# Patient Record
Sex: Male | Born: 1975
Health system: Southern US, Community
[De-identification: ages and names within clinical notes are randomized; demographics above are authoritative.]

## PROBLEM LIST (undated history)

## (undated) DIAGNOSIS — T7840XA Allergy, unspecified, initial encounter: Secondary | ICD-10-CM

## (undated) HISTORY — DX: Allergy, unspecified, initial encounter: T78.40XA

---

## 2003-07-05 ENCOUNTER — Ambulatory Visit (HOSPITAL_COMMUNITY): Admission: RE | Admit: 2003-07-05 | Discharge: 2003-07-05 | Payer: Self-pay | Admitting: Orthopedic Surgery

## 2015-12-28 ENCOUNTER — Ambulatory Visit (INDEPENDENT_AMBULATORY_CARE_PROVIDER_SITE_OTHER): Payer: Self-pay | Admitting: Family Medicine

## 2015-12-28 VITALS — BP 108/72 | HR 54 | Temp 97.6°F | Resp 16 | Ht 68.75 in | Wt 192.6 lb

## 2015-12-28 DIAGNOSIS — Z113 Encounter for screening for infections with a predominantly sexual mode of transmission: Secondary | ICD-10-CM

## 2015-12-28 DIAGNOSIS — R300 Dysuria: Secondary | ICD-10-CM

## 2015-12-28 DIAGNOSIS — R001 Bradycardia, unspecified: Secondary | ICD-10-CM

## 2015-12-28 DIAGNOSIS — Z23 Encounter for immunization: Secondary | ICD-10-CM

## 2015-12-28 LAB — POCT URINALYSIS DIP (MANUAL ENTRY)
BILIRUBIN UA: NEGATIVE
BILIRUBIN UA: NEGATIVE
Glucose, UA: NEGATIVE
Leukocytes, UA: NEGATIVE
Nitrite, UA: NEGATIVE
PH UA: 5.5
PROTEIN UA: NEGATIVE
RBC UA: NEGATIVE
SPEC GRAV UA: 1.01
Urobilinogen, UA: 0.2

## 2015-12-28 LAB — POC MICROSCOPIC URINALYSIS (UMFC): MUCUS RE: ABSENT

## 2015-12-28 MED ORDER — CEFTRIAXONE SODIUM 250 MG IJ SOLR
250.0000 mg | Freq: Once | INTRAMUSCULAR | Status: AC
  Administered 2015-12-28: 250 mg via INTRAMUSCULAR

## 2015-12-28 NOTE — Progress Notes (Signed)
Chief Complaint  Patient presents with  . burning in penis    x 1 wk    HPI  Pt here for burning in pain that is daily for the past week.  Noticed that it started after intercourse. Does not use condoms. Has intercourse with females. Pain is 3/10 Pain for 8 days Not sure if it is from the urine or his penis. The burning is there all day Aggravated by Anything spicy Urination does not make it worse Intercourse does not make it worse 5 years ago he had gonorrhea and was treated with injection  He reports that his urine is clear when he drinks water Denies seeing blood in the urine No discharge from the penis  Bradycardia Reports that he exercises and weight lifts Does not have a history of dizziness or palpitations with weight lifting and exercise Drinks plenty of water No family history of heart disease  Desires Flu Vaccination He desires a flu vaccination today Denies history of egg allergy or allergy to flu vaccination No fevers or chills today  No past medical history on file.  No current outpatient prescriptions on file.   Current Facility-Administered Medications  Medication Dose Route Frequency Provider Last Rate Last Dose  . cefTRIAXone (ROCEPHIN) injection 250 mg  250 mg Intramuscular Once Doristine BosworthZoe A Stallings, MD        No Known Allergies   No past surgical history on file.  Social History   Social History  . Marital status: Married    Spouse name: N/A  . Number of children: N/A  . Years of education: N/A   Social History Main Topics  . Smoking status: Never Smoker  . Smokeless tobacco: Never Used  . Alcohol use No  . Drug use: No  . Sexual activity: Not Asked   Other Topics Concern  . None   Social History Narrative  . None    ROS  Objective: Vitals:   12/28/15 1139  BP: 108/72  Pulse: (!) 54  Resp: 16  Temp: 97.6 F (36.4 C)  TempSrc: Oral  SpO2: 99%  Weight: 192 lb 9.6 oz (87.4 kg)  Height: 5' 8.75" (1.746 m)    Physical  Exam  Constitutional: He is oriented to person, place, and time. He appears well-developed and well-nourished.  Athletic build  HENT:  Head: Normocephalic and atraumatic.  Cardiovascular: Regular rhythm and normal heart sounds.   HR 54  Pulmonary/Chest: Effort normal and breath sounds normal. He has no wheezes.  Musculoskeletal:  No flank pain  Neurological: He is alert and oriented to person, place, and time.   UA negative for infection   Assessment and Plan Nathan Parrish was seen today for burning in penis.  Diagnoses and all orders for this visit:  Burning with urination- UA and urine micro negative for infection Will treat empirically for urethritis with Rocephin -     POCT Microscopic Urinalysis ([facility]) -     POCT urinalysis dipstick -     cefTRIAXone (ROCEPHIN) injection 250 mg; Inject 250 mg into the muscle once.  Flu vaccine need -     Flu Vaccine QUAD 36+ mos IM  Screen for STD (sexually transmitted disease)- verbal consent received for HIV -     GC/Chlamydia Probe Amp -     RPR -     HIV antibody  Bradycardia with 51-60 beats per minute- advised to monitor pulse Since he works out this is most likely normal At this time he is asymptomatic and does  not need further evaluation     Zoe A Creta Levin

## 2015-12-28 NOTE — Patient Instructions (Addendum)
   IF you received an x-ray today, you will receive an invoice from [facility]. Please contact [facility] at [phone] with questions or concerns regarding your invoice.   IF you received labwork today, you will receive an invoice from [facility]. Please contact [facility] at [phone] with questions or concerns regarding your invoice.   Our billing staff will not be able to assist you with questions regarding bills from these companies.  You will be contacted with the lab results as soon as they are available. The fastest way to get your results is to activate your My Chart account. Instructions are located on the last page of this paperwork. If you have not heard from us regarding the results in 2 weeks, please contact this office.     Disuria (Dysuria) La disuria es dolor o molestia al orinar. El dolor o la molestia se pueden sentir en el conducto que transporta la orina fuera de la vejiga (uretra) o en el tejido que rodea los genitales. El dolor tambin se puede sentir en la zona de la ingle y en la parte inferior del abdomen y de la espalda. Quizs tenga que orinar con frecuencia o la sensacin repentina de tener que orinar (tenesmo vesical). La disuria puede afectar tanto a hombres como a mujeres, pero es ms comn en las mujeres. La causa puede deberse a muchos problemas diferentes:  Infeccin en las vas urinarias en mujeres.  Infeccin en los riones o la vejiga.  Clculos en los riones o la vejiga.  Ciertas enfermedades de transmisin sexual (ETS), como la clamidia.  Deshidratacin.  Inflamacin de la vagina.  Uso de ciertos medicamentos.  Uso de ciertos jabones o productos perfumados que provocan irritacin. INSTRUCCIONES PARA EL CUIDADO EN EL HOGAR Controle su disuria para ver si hay cambios. Las siguientes indicaciones pueden ayudar a aliviar cualquier molestia que pueda sentir:  Beba suficiente lquido para  mantener la orina clara o de color amarillo plido.  Vace la vejiga con frecuencia. Evite retener la orina durante largos perodos.  Despus de defecar, las mujeres deben limpiarse desde adelante hacia atrs, usando el papel higinico solo una vez.  Vace la vejiga despus de tener relaciones sexuales.  Tome los medicamentos solamente como se lo haya indicado el mdico.  Si le recetaron antibiticos, asegrese de terminarlos, incluso si comienza a sentirse mejor.  Evite la cafena, el t y el alcohol. Estos productos pueden irritar la vejiga y empeorar la disuria. En los hombres, el alcohol puede irritar la prstata.  Concurra a todas las visitas de control como se lo haya indicado el mdico. Esto es importante.  Si le realizaron pruebas para detectar la causa de la disuria, es su responsabilidad retirar los resultados. Consulte en el laboratorio o en el departamento en el que fue realizado el estudio cundo y cmo podr obtener los resultados. Hable con el mdico si tiene alguna pregunta sobre los resultados. SOLICITE ATENCIN MDICA SI:  Siente dolor en la espalda o a los costados del cuerpo.  Tiene fiebre.  Tiene nuseas o vmitos.  Observa sangre en la orina.  Est orinando con ms frecuencia que lo habitual. SOLICITE ATENCIN MDICA DE INMEDIATO SI:  El dolor es intenso y no se alivia con los medicamentos.  No puede retener lquido.  Usted u otra persona advierten algn cambio en su funcin mental.  Tiene una frecuencia cardaca acelerada en reposo.  Tiene temblores o escalofros.  Se siente muy dbil.   Esta informacin no   tiene como fin reemplazar el consejo del mdico. Asegrese de hacerle al mdico cualquier pregunta que tenga.   Document Released: 03/29/2007 Document Revised: 03/30/2014 Elsevier Interactive Patient Education 2016 Elsevier Inc.  

## 2015-12-29 LAB — HIV ANTIBODY (ROUTINE TESTING W REFLEX): HIV: NONREACTIVE

## 2015-12-31 LAB — GC/CHLAMYDIA PROBE AMP
CT PROBE, AMP APTIMA: NOT DETECTED
GC Probe RNA: NOT DETECTED

## 2015-12-31 LAB — RPR

## 2017-01-03 ENCOUNTER — Emergency Department (HOSPITAL_COMMUNITY): Payer: Self-pay

## 2017-01-03 ENCOUNTER — Encounter (HOSPITAL_COMMUNITY): Payer: Self-pay

## 2017-01-03 ENCOUNTER — Emergency Department (HOSPITAL_COMMUNITY)
Admission: EM | Admit: 2017-01-03 | Discharge: 2017-01-03 | Disposition: A | Discharge status: Home / Self Care | Payer: Self-pay | Attending: Emergency Medicine | Admitting: Emergency Medicine

## 2017-01-03 DIAGNOSIS — Y999 Unspecified external cause status: Secondary | ICD-10-CM | POA: Insufficient documentation

## 2017-01-03 DIAGNOSIS — Y93H2 Activity, gardening and landscaping: Secondary | ICD-10-CM | POA: Insufficient documentation

## 2017-01-03 DIAGNOSIS — S060X0A Concussion without loss of consciousness, initial encounter: Secondary | ICD-10-CM | POA: Insufficient documentation

## 2017-01-03 DIAGNOSIS — W11XXXA Fall on and from ladder, initial encounter: Secondary | ICD-10-CM | POA: Insufficient documentation

## 2017-01-03 DIAGNOSIS — S0101XA Laceration without foreign body of scalp, initial encounter: Secondary | ICD-10-CM | POA: Insufficient documentation

## 2017-01-03 DIAGNOSIS — W19XXXA Unspecified fall, initial encounter: Secondary | ICD-10-CM

## 2017-01-03 DIAGNOSIS — S2242XA Multiple fractures of ribs, left side, initial encounter for closed fracture: Secondary | ICD-10-CM | POA: Insufficient documentation

## 2017-01-03 DIAGNOSIS — Y929 Unspecified place or not applicable: Secondary | ICD-10-CM | POA: Insufficient documentation

## 2017-01-03 LAB — CBC
HCT: 40.7 % (ref 39.0–52.0)
Hemoglobin: 14.4 g/dL (ref 13.0–17.0)
MCH: 30.5 pg (ref 26.0–34.0)
MCHC: 35.4 g/dL (ref 30.0–36.0)
MCV: 86.2 fL (ref 78.0–100.0)
Platelets: 184 10*3/uL (ref 150–400)
RBC: 4.72 MIL/uL (ref 4.22–5.81)
RDW: 12.7 % (ref 11.5–15.5)
WBC: 6.5 10*3/uL (ref 4.0–10.5)

## 2017-01-03 LAB — BASIC METABOLIC PANEL
Anion gap: 8 (ref 5–15)
BUN: 18 mg/dL (ref 6–20)
CO2: 25 mmol/L (ref 22–32)
Calcium: 8.8 mg/dL — ABNORMAL LOW (ref 8.9–10.3)
Chloride: 103 mmol/L (ref 101–111)
Creatinine, Ser: 1.2 mg/dL (ref 0.61–1.24)
GFR calc Af Amer: >60 mL/min (ref >60)
GFR calc non Af Amer: >60 mL/min (ref >60)
Glucose, Bld: 108 mg/dL — ABNORMAL HIGH (ref 65–99)
Potassium: 3.6 mmol/L (ref 3.5–5.1)
Sodium: 136 mmol/L (ref 135–145)

## 2017-01-03 MED ORDER — LIDOCAINE-EPINEPHRINE (PF) 2 %-1:200000 IJ SOLN
10.0000 mL | Freq: Once | INTRAMUSCULAR | Status: AC
  Administered 2017-01-03: 10 mL

## 2017-01-03 MED ORDER — FENTANYL CITRATE (PF) 100 MCG/2ML IJ SOLN
50.0000 ug | Freq: Once | INTRAMUSCULAR | Status: AC
  Administered 2017-01-03: 50 ug via INTRAVENOUS

## 2017-01-03 MED ORDER — TETANUS-DIPHTH-ACELL PERTUSSIS 5-2.5-18.5 LF-MCG/0.5 IM SUSP
0.5000 mL | Freq: Once | INTRAMUSCULAR | Status: AC
  Administered 2017-01-03: 0.5 mL via INTRAMUSCULAR

## 2017-01-03 MED ORDER — LIDOCAINE-EPINEPHRINE (PF) 2 %-1:200000 IJ SOLN
INTRAMUSCULAR | Status: AC
  Filled 2017-01-03: qty 20

## 2017-01-03 MED ORDER — OXYCODONE-ACETAMINOPHEN 5-325 MG PO TABS
2.0000 | ORAL_TABLET | Freq: Once | ORAL | Status: AC
  Administered 2017-01-03: 2 via ORAL
  Filled 2017-01-03: qty 2

## 2017-01-03 MED ORDER — TETANUS-DIPHTH-ACELL PERTUSSIS 5-2.5-18.5 LF-MCG/0.5 IM SUSP
INTRAMUSCULAR | Status: AC
  Filled 2017-01-03: qty 0.5

## 2017-01-03 MED ORDER — OXYCODONE-ACETAMINOPHEN 5-325 MG PO TABS: 1.0000 | ORAL_TABLET | Freq: Four times a day (QID) | ORAL | 0 refills | Status: DC | PRN

## 2017-01-03 MED ORDER — FENTANYL CITRATE (PF) 100 MCG/2ML IJ SOLN
50.0000 ug | Freq: Once | INTRAMUSCULAR | Status: AC
  Administered 2017-01-03: 50 ug via INTRAVENOUS
  Filled 2017-01-03: qty 2

## 2017-01-03 MED ORDER — FENTANYL CITRATE (PF) 100 MCG/2ML IJ SOLN
INTRAMUSCULAR | Status: AC
  Filled 2017-01-03: qty 2

## 2017-01-03 NOTE — Discharge Instructions (Signed)
Take Percocet for pain as we discussed.  Follow-up with your primary care physician for re-evaluation in the next 2-3 days.  Have your staples removed in 10 days.  This can be done by your PCP, at an Urgent Care or in the Emergency Department.

## 2017-01-03 NOTE — ED Notes (Signed)
Family at beside. Family given emotional support. 

## 2017-01-03 NOTE — ED Provider Notes (Signed)
[facility]-EMERGENCY DEPT Provider Note   CSN: [id number] Arrival date & time: 01/03/17  9562  History   Chief Complaint Chief Complaint  Patient presents with  . Fall    HPI Nathan Parrish is a 41 y.o. male.  The patient is a healthy 41yo Spanish-speaking male who presents to the ED as a Level 2 trauma after a fall off a 12-foot ladder. The patient was near the top of his ladder using a chainsaw to cut down tree branches when a tree branch swung and knocked the ladder out from underneath him. He fell backwards off of the ladder and landed on his left shoulder on top of the chainsaw handle.  The episode was witnessed by his son, who states the patient did not lose consciousness at any time or vomiting following the fall.  In route to the ED, the patient was hemodynamic stable per EMS. He arrived wearing a cervical collar.  Per report, he was alert and oriented to person only with frequent questioning regarding his whereabouts.  At this time, the patient is complaining of pain to the left, posterior side of his head as well as his left posterior shoulder and mid back.  The patient does not take any medications, including blood thinners.   The history is provided by the patient, a relative and the EMS personnel. The history is limited by a language barrier. Language interpreter used: family members used for translation at the time of initial evaluation.   No past medical history on file.  There are no active problems to display for this patient.  No past surgical history on file.  Home Medications    Prior to Admission medications   Not on File   Family History No family history on file.  Social History Social History  Substance Use Topics  . Smoking status: Not on file  . Smokeless tobacco: Not on file  . Alcohol use Not on file   Allergies   Patient has no allergy information on record.  Review of Systems Review of Systems  Unable to perform ROS: Acuity of condition  Eyes:  Negative for visual disturbance.  Respiratory: Negative for shortness of breath.   Cardiovascular: Negative for chest pain.  Gastrointestinal: Negative for abdominal pain, nausea and vomiting.  Genitourinary: Negative for difficulty urinating.  Musculoskeletal: Positive for arthralgias (left shoulder) and back pain.  Allergic/Immunologic: Negative for immunocompromised state.  Neurological: Positive for headaches.  Hematological: Does not bruise/bleed easily.   Physical Exam Updated Vital Signs BP 120/90 Comment: manual   Pulse 62   Resp 16   SpO2 98%   Physical Exam  Constitutional: He appears well-developed and well-nourished.  HENT:  Head: Normocephalic.  Two lacerations to the left scalp in the occipitoparietal region, ~ 2cm and 1cm; hemostatic  Eyes: Pupils are equal, round, and reactive to light. Conjunctivae and EOM are normal.  Neck: Neck supple. No tracheal deviation present.  c-collar in place; no C-spine TTP  Cardiovascular: Normal rate, regular rhythm, normal heart sounds and intact distal pulses.   No murmur heard. Pulmonary/Chest: Effort normal and breath sounds normal. No respiratory distress.  Abdominal: Soft. He exhibits no mass. There is no tenderness. There is no guarding.  Musculoskeletal: He exhibits no edema.  No pelvic instability on lateral compression; tenderness to palpation over upper thoracic spine; no lumbar spine TTP  Neurological: He is alert.  Alert; oriented to person only; asking repetitive questions; patient following commands appropriately and moving all extremities spontaneously  Skin:  Skin is warm and dry.  Psychiatric: His behavior is normal.  Nursing note and vitals reviewed.  ED Treatments / Results  Labs (all labs ordered are listed, but only abnormal results are displayed) Labs Reviewed - No data to display  EKG  EKG Interpretation None      Radiology No results found.  Procedures .Marland KitchenLaceration Repair Date/Time: 01/03/2017  10:30 PM Performed by: Levester Fresh Authorized by: Pricilla Loveless   Anesthesia (see MAR for exact dosages):    Anesthesia method:  None Laceration details:    Location:  Scalp   Scalp location:  Occipital   Wound length (cm): 2cm and 1cm.   Laceration depth: partial thickness. Repair type:    Repair type:  Simple Pre-procedure details:    Preparation:  Patient was prepped and draped in usual sterile fashion and imaging obtained to evaluate for foreign bodies Exploration:    Wound exploration: entire depth of wound probed and visualized     Contaminated: no   Treatment:    Area cleansed with:  Saline   Amount of cleaning:  Standard   Irrigation solution:  Sterile saline   Irrigation volume:  500cc   Irrigation method:  Pressure wash Skin repair:    Repair method:  Staples   Number of staples:  3 Approximation:    Approximation:  Close   Vermilion border: well-aligned   Post-procedure details:    Dressing:  Antibiotic ointment   Patient tolerance of procedure:  Tolerated well, no immediate complications   (including critical care time)  Medications Ordered in ED Medications - No data to display   Initial Impression / Assessment and Plan / ED Course  I have reviewed the triage vital signs and the nursing notes.  Pertinent labs & imaging results that were available during my care of the patient were reviewed by me and considered in my medical decision making (see chart for details).    Initial differential diagnosis included intracranial bleed, spinal fracture, rib fracture, pneumothorax, scapular fracture, soft tissue injury, and laceration.  Pertinent labs included an unremarkable CBC and BMP.  Imaging studies  Fractures of the left posterior first, second, third.. Intracranial abnormality. No spinal fractures noted.  The patient was given IV narcotics for analgesia and was re-dosed once in the ED.    Upon reassessment, mental status was rapidly improving. He was alert  and oriented to person, place, time, and event.  The patient's scalp lacerations were repaired as detailed above.  I offered to admit the patient for pain management, however he preferred to go home with oral pain medication.  Based on the above findings, I suspect the patient most likely suffered from a concussion, in addition to the injuries noted above.  I have a low suspicion for syncope prior to the patient's fall, given that this was a witnessed episode as detailed above.  I personally ambulated the patient prior to discharge.  He was able to walk without assistance with minimal pain.  I also instructed the patient on the use of an incentive spirometer; 1750cc pulled in the ED.  I discussed the above results with the patient who verbalized understanding.  Return precautions and follow-up plans discussed including has follow-up with his primary care physician and suture removal in 10 days.  The patient was discharged in stable condition.  He was given a prescription for Percocet.  I reviewed the [place] Controlled Substances Reporting System prior to providing a prescription for narcotics with no red flags noted on  my review.  Final Clinical Impressions(s) / ED Diagnoses   Final diagnoses:  Concussion without loss of consciousness, initial encounter  Fall, initial encounter  Laceration of scalp, initial encounter  Closed fracture of multiple ribs of left side, initial encounter   New Prescriptions New Prescriptions   No medications on file     Levester Fresh, MD 01/04/17 0130    Pricilla Loveless, MD 01/06/17 1421

## 2017-01-03 NOTE — ED Notes (Signed)
Pt to CT via stretcher

## 2017-01-03 NOTE — ED Notes (Addendum)
Pt comes via [facility], was cutting down a tree and limb knocked ladder out from under him, fell onto chainsaw, lacerations to back pf head, repeative questions, c/o of L shoulder pain, positive LOC

## 2017-01-03 NOTE — Progress Notes (Signed)
Orthopedic Tech Progress Note Patient Details:  Joanna Hall Mar 19, 1976 [id number] Level 2 trauma ortho visit. Patient ID: Boe Deans, male   DOB: Mar 18, 1976, 41 y.o.   MRN: [id number]   Jennye Moccasin 01/03/2017, 8:39 PM

## 2017-04-27 ENCOUNTER — Ambulatory Visit: Payer: Self-pay | Admitting: Family Medicine

## 2017-06-01 ENCOUNTER — Ambulatory Visit (INDEPENDENT_AMBULATORY_CARE_PROVIDER_SITE_OTHER): Payer: BLUE CROSS/BLUE SHIELD | Admitting: Family Medicine

## 2017-06-01 ENCOUNTER — Encounter: Payer: Self-pay | Admitting: Family Medicine

## 2017-06-01 VITALS — BP 126/80 | HR 50 | Temp 97.8°F | Resp 12 | Ht 68.0 in | Wt 196.4 lb

## 2017-06-01 DIAGNOSIS — J309 Allergic rhinitis, unspecified: Secondary | ICD-10-CM

## 2017-06-01 DIAGNOSIS — R351 Nocturia: Secondary | ICD-10-CM

## 2017-06-01 DIAGNOSIS — Z0000 Encounter for general adult medical examination without abnormal findings: Secondary | ICD-10-CM

## 2017-06-01 DIAGNOSIS — R001 Bradycardia, unspecified: Secondary | ICD-10-CM

## 2017-06-01 DIAGNOSIS — N401 Enlarged prostate with lower urinary tract symptoms: Secondary | ICD-10-CM

## 2017-06-01 MED ORDER — FLUTICASONE PROPIONATE 50 MCG/ACT NA SUSP: 1.0000 | Freq: Two times a day (BID) | NASAL | 6 refills | Status: DC

## 2017-06-01 NOTE — Assessment & Plan Note (Signed)
Before I order further workup, PSA and U/A, I would like to see labs done for life insurance.  He reports having urine test. Recommend avoiding fluid intake within 3-4 hours before bedtime.

## 2017-06-01 NOTE — Progress Notes (Signed)
HPI:   [name] is a 42 y.o. male, who is here today with his wife to establish care.  Former PCP: N/A Last preventive routine visit: Over a year ago  Chronic medical problems: Otherwise healthy, denies history of hypertension, DM, or hyperlipidemia.   Concerns today: He would like to have a CPE.  He is reporting lab work recently done for life insurance about 3 weeks ago.  According to patient, lab work was normal.  He lives with his wife and 5 children. Denies Hx of STD's.  He does exercise regularly but has not done so recently due to recent injury, 3-4 weeks ago. He follows a healthy diet.   Nocturia X 2 for many years. Denies dysuria,increased urinary frequency, gross hematuria,or decreased urine output.  Nasal congestion, rhinorrhea, epiphora, and eye pruritus intermittently for years. He has not used OTC medications. He denies fever, chills, body aches, or sore throat. Nasal congestion worse at night.   On examination bradycardia was noted. He exercises regularly, has not done so for about a month due to injury. In general he denies chest pain, dyspnea, dizziness, or diaphoresis.  States that sometimes he feels fatigue.   Review of Systems  Constitutional: Positive for fatigue. Negative for activity change, appetite change, fever and unexpected weight change.  HENT: Negative for dental problem, nosebleeds, sore throat, trouble swallowing and voice change.   Eyes: Negative for redness and visual disturbance.  Respiratory: Negative for cough, shortness of breath and wheezing.   Cardiovascular: Negative for chest pain, palpitations and leg swelling.  Gastrointestinal: Negative for abdominal pain, blood in stool, nausea and vomiting.       No changes in bowel habits.  Endocrine: Negative for cold intolerance, heat intolerance, polydipsia, polyphagia and polyuria.  Genitourinary: Negative for decreased urine volume, dysuria, genital sores,  hematuria and testicular pain.  Musculoskeletal: Negative for gait problem and myalgias.  Skin: Negative for color change and rash.  Allergic/Immunologic: Positive for environmental allergies.  Neurological: Negative for dizziness, syncope, weakness and headaches.  Hematological: Negative for adenopathy. Does not bruise/bleed easily.  Psychiatric/Behavioral: Negative for confusion and sleep disturbance. The patient is not nervous/anxious.   All other systems reviewed and are negative.   No current outpatient medications on file prior to visit.   No current facility-administered medications on file prior to visit.      Past Medical History:  Diagnosis Date  . Allergy    No Known Allergies  Family History  Problem Relation Age of Onset  . Hypertension Mother   . Hypertension Father   . Asthma Sister   . Asthma Sister   . Asthma Sister     Social History   Socioeconomic History  . Marital status: Married    Spouse name: None  . Number of children: None  . Years of education: None  . Highest education level: None  Social Needs  . Financial resource strain: None  . Food insecurity - worry: None  . Food insecurity - inability: None  . Transportation needs - medical: None  . Transportation needs - non-medical: None  Occupational History  . None  Tobacco Use  . Smoking status: Never Smoker  . Smokeless tobacco: Never Used  Substance and Sexual Activity  . Alcohol use: Yes  . Drug use: No  . Sexual activity: Yes    Partners: Female  Other Topics Concern  . None  Social History Narrative   ** Merged History Encounter **  Vitals:   06/01/17 0812  BP: 126/80  Pulse: (!) 50  Resp: 12  Temp: 97.8 F (36.6 C)  SpO2: 98%    Body mass index is 29.86 kg/m.   Physical Exam  Nursing note and vitals reviewed. Constitutional: He is oriented to person, place, and time. He appears well-developed. No distress.  HENT:  Head: Normocephalic and atraumatic.    Right Ear: Tympanic membrane, external ear and ear canal normal.  Left Ear: Tympanic membrane, external ear and ear canal normal.  Mouth/Throat: Oropharynx is clear and moist and mucous membranes are normal.  Hypertrophic turbinates.   Eyes: Conjunctivae and EOM are normal. Pupils are equal, round, and reactive to light.  Pinguecula nasal angle bilateral.  Neck: Normal range of motion. No tracheal deviation present. No thyromegaly present.  Cardiovascular: Regular rhythm. Bradycardia present.  No murmur heard. Pulses:      Dorsalis pedis pulses are 2+ on the right side, and 2+ on the left side.  Respiratory: Effort normal and breath sounds normal. No respiratory distress.  GI: Soft. He exhibits no mass. There is no tenderness. Hernia confirmed negative in the right inguinal area and confirmed negative in the left inguinal area.  Genitourinary: Penis normal. Prostate is enlarged (Mildly). Prostate is not tender. Right testis shows no mass, no swelling and no tenderness. Left testis shows no mass, no swelling and no tenderness.  Musculoskeletal: He exhibits no edema or tenderness.  No major deformities appreciated and no signs of synovitis.  Lymphadenopathy:    He has no cervical adenopathy.       Right: No inguinal and no supraclavicular adenopathy present.       Left: No inguinal and no supraclavicular adenopathy present.  Neurological: He is alert and oriented to person, place, and time. He has normal strength. No cranial nerve deficit or sensory deficit. Coordination and gait normal.  Reflex Scores:      Bicep reflexes are 2+ on the right side and 2+ on the left side.      Patellar reflexes are 2+ on the right side and 2+ on the left side. Skin: Skin is warm. No erythema.  Psychiatric: His mood appears anxious. Cognition and memory are normal.  Well groomed, good eye contact.    ASSESSMENT AND PLAN:  Mr. Nathan Parrish was seen today for establish care and annual exam.  Orders Placed This  Encounter  Procedures  . Ambulatory referral to Cardiology  . EKG 12-Lead     Routine general medical examination at a health care facility  We discussed the importance of regular physical activity and healthy diet for prevention of chronic illness and/or complications. Preventive guidelines reviewed. Vaccination up to date per pt report. I will hold on lab work until he brings copy of recent labs. Next CPE in a year.   BPH associated with nocturia Before I order further workup, PSA and U/A, I would like to see labs done for life insurance.  He reports having urine test. Recommend avoiding fluid intake within 3-4 hours before bedtime.  Allergic rhinitis Educated about diagnosis. Recommend OTC Zyrtec 10 mg daily, nasal irrigation with saline as needed. Flonase nasal spray 1 spray in each nostril twice daily. Instructed to let me know in about 3 months if he still having symptoms, in which case we might add Singulair 10 mg at night.  Bradycardia, sinus Asymptomatic. EKG today with marked sinus bradycardia (37/min), so cardiology evaluation recommended. He was clearly instructed about warning signs.     Kathie RhodesBetty  G. Martinique, MD  [facility]. [place] office.

## 2017-06-01 NOTE — Assessment & Plan Note (Signed)
Educated about diagnosis. Recommend OTC Zyrtec 10 mg daily, nasal irrigation with saline as needed. Flonase nasal spray 1 spray in each nostril twice daily. Instructed to let me know in about 3 months if he still having symptoms, in which case we might add Singulair 10 mg at night.

## 2017-06-01 NOTE — Patient Instructions (Addendum)
A few things to remember from today's visit:   Routine general medical examination at a health care facility  Allergic rhinitis, unspecified seasonality, unspecified trigger  Bradycardia, sinus - Plan: EKG 12-Lead  Mantenimiento de la salud en los hombres (Health Maintenance, Male) Un estilo de vida saludable y los cuidados preventivos son importantes para la salud y Counsellorel bienestar. Pregntele al mdico cul es el cronograma de exmenes peridicos adecuado para usted. QU DEBO SABER SOBRE EL PESO Y LA DIETA? Consuma una dieta saludable  Coma muchas verduras, frutas, cereales integrales, productos lcteos con bajo contenido de grasa y Associate Professorprotenas magras.  No consuma muchos alimentos de alto contenido de grasas slidas, azcares agregados o sal. Mantenga un peso saludable La actividad fsica habitual puede ayudarlo a Baristaalcanzar o mantener un peso saludable. Deber hacer lo siguiente:  Realizar al menos 150minutos de actividad fsica por semana. El ejercicio debe aumentar la frecuencia cardaca y Development worker, international aidprovocar la transpiracin (ejercicio de Walkertownintensidad moderada).  Hacer ejercicios de entrenamiento de fuerza por lo Rite Aidmenos dos veces por semana. Controlarse los niveles de colesterol y lpidos en la sangre  Hgase anlisis de sangre para controlar los lpidos y el colesterol cada 5aos a partir de los 35aos. Si tiene un riesgo alto de Warehouse managertener cardiopatas coronarias, debe comenzar a Assuranthacerse anlisis de [place] Cooksangre a los [place] Walk20aos. Es posible que Insurance underwriternecesite controlar los niveles de colesterol con mayor frecuencia si: ? Sus niveles de lpidos y colesterol son altos. ? Es mayor de [id number]. ? Tiene un riesgo alto de tener cardiopatas coronarias. QU DEBO SABER SOBRE LAS PRUEBAS DE DETECCIN DEL CNCER? Muchos tipos de cncer se pueden detectar de manera temprana y a menudo prevenirse. Cncer de pulmn  Debe someterse a pruebas de deteccin de cncer de pulmn todos los aos en los siguientes casos: ? Si fuma  actualmente y lo ha hecho durante por lo menos 30aos. ? Si fue fumador que dej el hbito en el trmino de los ltimos 15aos.  Hable con el mdico sobre las opciones en relacin con los estudios de deteccin, cundo debe comenzar a Actuaryhacrselos y con Engineer, structuralqu frecuencia. Cncer colorrectal  Generalmente, las pruebas de deteccin habituales del cncer colorrectal comienzan a los 50aos y deben repetirse cada 5 a 10aos hasta los 75aos. Es posible que tenga que hacerse las pruebas con mayor frecuencia si se detectan formas tempranas de plipos precancerosos o pequeos bultos. Sin embargo, el mdico podr aconsejarle que lo haga antes, si tiene factores de riesgo para el cncer de colon.  El mdico puede recomendarle que use kits de prueba caseros para Recruitment consultanthallar sangre oculta en la materia fecal.  Se puede usar una pequea cmara en el extremo de un tubo para examinar el colon (sigmoidoscopia o colonoscopia). Este estudio PPG Industriesdetecta las formas ms tempranas de Building services engineercncer colorrectal. Cncer de prstata y de testculo  En funcin de la edad y del Sunsetestado de salud general, el mdico puede realizarle determinados estudios de deteccin del cncer de prstata y de testculo.  Hable con el mdico sobre cualquier sntoma o acerca de las inquietudes que tenga sobre el cncer de prstata o de testculo. Cncer de piel  Revise la piel de la cabeza a los pies con regularidad.  Informe al mdico si aparecen nuevos lunares o si nota cambios en los que ya tiene, especialmente en estos casos: ? Si hay un cambio en el tamao, la forma o el color del lunar. ? Si tiene un lunar que es ms grande que el tamao de [place] goma  de lpiz.  Siempre use pantalla solar. Aplquese pantalla solar de [place] y repetida a lo largo del Futures trader.  Use mangas y Automatic Data, un sombrero de ala ancha y gafas para el sol cuando est al Gu[place]upe Dawn, para protegerse. QU DEBO SABER SOBRE LAS CARDIOPATAS CORONARIAS, LA DIABETES Y LA  HIPERTENSIN ARTERIAL?  Si usted tiene entre 18 y 39aos, debe medirse la presin arterial cada 3a 5aos. Si usted tiene 40aos o ms, debe medirse la presin arterial [employer]. Debe medirse la presin arterial dos veces: una vez cuando est en un hospital o una clnica y la otra vez cuando est en otro sitio. Registre el promedio de [employer]. Para controlar su presin arterial cuando no est en un hospital o Paulita Cradle, puede usar lo siguiente: ? Valere Dross automtica para medir la presin arterial en una farmacia. ? Un monitor para medir la presin arterial en el hogar.  Hable con el mdico [employer] ideales de la presin arterial.  Si tiene entre 45 y 79aos, consltele al mdico si debe tomar aspirina para evitar las cardiopatas coronarias.  Hgase anlisis habituales de deteccin de la diabetes; para ello, contrlese la glucemia en ayunas. ? Si su peso es normal y tiene un bajo riesgo de padecer diabetes, realcese este anlisis cada tres aos despus de los 45aos. ? Si tiene sobrepeso y un alto riesgo de padecer diabetes, considere someterse a este anlisis antes o con mayor frecuencia.  Para los hombres que tienen entre 65 y 56aos, y son o han sido fumadores, se recomienda un nico estudio con ecografa para Engineer, manufacturing un aneurisma de aorta abdominal (AAA). QU DEBO SABER SOBRE LA PREVENCIN DE LAS INFECCIONES? HepatitisB Si tiene un riesgo ms alto de Primary school teacher hepatitis B, debe someterse a un examen de deteccin de este virus. Hable con el mdico para determinar si corre riesgo de tener una infeccin por hepatitisB. Hepatitis C Se recomienda un anlisis de [place] para:  Todos los que nacieron entre 1945 y [phone].  Todas las personas que tengan un riesgo de haber contrado hepatitis C. Enfermedades de transmisin sexual (ETS)  Debe realizarse pruebas de Airline pilot de las ETS todos los aos, incluidas la gonorrea y la clamidia, en estos casos: ? Es  sexualmente activo y es menor de [place]. ? Es mayor de 24aos, y Public affairs consultant informa que corre riesgo de tener este tipo de infecciones. ? La actividad sexual ha cambiado desde que le hicieron la ltima prueba de deteccin y tiene un riesgo mayor de Warehouse manager clamidia o Copy. Pregntele al mdico si usted tiene riesgo.  Consulte a su mdico para saber si tiene un alto riesgo de infectarse por el VIH. El mdico puede recomendarle un medicamento de venta con receta para ayudar a evitar la infeccin por el VIH. QU MS PUEDO HACER?  Realcese los estudios de rutina de la salud, dentales y de Wellsite geologist.  Mantngase al da con las vacunas (inmunizaciones).  No consuma ningn producto que contenga tabaco, lo que incluye cigarrillos, tabaco de Theatre manager y Administrator, Civil Service. Si necesita ayuda para dejar de fumar, consulte al mdico.  Limite el consumo de alcohol a no ms de por da. [employer] a 12 onzas de cerveza, 5onzas de vino o 1onzas de bebidas alcohlicas de alta graduacin.  No consuma drogas.  No comparta agujas.  Solicite ayuda a su mdico si necesita apoyo o informacin para abandonar las drogas.  Informe a su mdico si a  menudo se siente deprimido.  Notifique a su mdico si alguna vez ha sido vctima de abuso o si no se siente seguro en su hogar. Esta informacin no tiene Theme park manager el consejo del mdico. Asegrese de hacerle al mdico cualquier pregunta que tenga. Document Released: 09/05/2007 Document Revised: 03/30/2014 Document Reviewed: 12/11/2014 Elsevier Interactive Patient Education  2018 [employer].   Please be sure medication list is accurate. If a new problem present, please set up appointment sooner than planned today.

## 2017-06-01 NOTE — Assessment & Plan Note (Signed)
Asymptomatic. EKG today with marked sinus bradycardia (37/min), so cardiology evaluation recommended. He was clearly instructed about warning signs.

## 2017-06-06 ENCOUNTER — Encounter: Payer: Self-pay | Admitting: Family Medicine

## 2017-07-01 NOTE — Progress Notes (Signed)
ACUTE VISIT   HPI:  Chief Complaint  Patient presents with  . Chest Pain    patient complains of left-sided chest pain x1 month, states he has an appointment with the cardiologist on 5/2    Nathan Parrish is a 42 y.o. male, who is here today with his wife and cousin complaining of 2 weeks of left-sided chest pain. He cannot describe pain, "little pain." Pain is constant, he has not identified exacerbating or alleviating factors, 2-3/10, no radiated, and no associated symptoms. No dyspnea, palpitations, dizziness, diaphoresis, or syncope. No abdominal pain, nausea, or vomiting.  Occasionally "little indigestion", depending of type of food he eats, no heartburn.  Last visit marked bradycardia noted, appointment with cardiologist arranged.  No history of hyperlipidemia, hypertension, diabetes, or tobacco use.  No family history of CAD.  Review of Systems  Constitutional: Negative for activity change, appetite change, fatigue, fever and unexpected weight change.  HENT: Negative for sore throat and trouble swallowing.   Eyes: Negative for redness and visual disturbance.  Respiratory: Negative for cough, shortness of breath and wheezing.   Cardiovascular: Positive for chest pain. Negative for palpitations and leg swelling.  Gastrointestinal: Negative for abdominal pain, nausea and vomiting.  Musculoskeletal: Negative for back pain and gait problem.  Skin: Negative for rash and wound.  Neurological: Negative for dizziness, syncope, weakness and headaches.  Psychiatric/Behavioral: Negative for confusion. The patient is nervous/anxious.       Current Outpatient Medications on File Prior to Visit  Medication Sig Dispense Refill  . CALCIUM PO Take by mouth.    . fluticasone (FLONASE) 50 MCG/ACT nasal spray Place 1 spray into both nostrils 2 (two) times daily. 16 g 6   No current facility-administered medications on file prior to visit.      Past Medical History:    Diagnosis Date  . Allergy    No Known Allergies  Social History   Socioeconomic History  . Marital status: Married    Spouse name: Not on file  . Number of children: Not on file  . Years of education: Not on file  . Highest education level: Not on file  Occupational History  . Not on file  Social Needs  . Financial resource strain: Not on file  . Food insecurity:    Worry: Not on file    Inability: Not on file  . Transportation needs:    Medical: Not on file    Non-medical: Not on file  Tobacco Use  . Smoking status: Never Smoker  . Smokeless tobacco: Never Used  Substance and Sexual Activity  . Alcohol use: Yes  . Drug use: No  . Sexual activity: Yes    Partners: Female  Lifestyle  . Physical activity:    Days per week: Not on file    Minutes per session: Not on file  . Stress: Not on file  Relationships  . Social connections:    Talks on phone: Not on file    Gets together: Not on file    Attends religious service: Not on file    Active member of club or organization: Not on file    Attends meetings of clubs or organizations: Not on file    Relationship status: Not on file  Other Topics Concern  . Not on file  Social History Narrative   ** Merged History Encounter **        Vitals:   07/02/17 1533  BP: 94/64  Pulse: Marland Kitchen(!)  53  Resp: 12  Temp: 97.9 F (36.6 C)  SpO2: 98%   Body mass index is 29.83 kg/m.   Physical Exam  Nursing note and vitals reviewed. Constitutional: He is oriented to person, place, and time. He appears well-developed. No distress.  HENT:  Head: Normocephalic and atraumatic.  Mouth/Throat: Oropharynx is clear and moist and mucous membranes are normal.  Eyes: Pupils are equal, round, and reactive to light. EOM are normal. Right conjunctiva is not injected. Left conjunctiva is not injected.  Cardiovascular: Regular rhythm. Bradycardia present.  No murmur heard. HR 52/min by my count.  Respiratory: Effort normal and breath  sounds normal. No respiratory distress. He exhibits no tenderness.  GI: Soft. He exhibits no mass. There is no hepatomegaly. There is no tenderness.  Musculoskeletal: He exhibits no edema or tenderness.  Pain is not reproducible with shoulder ROM, palpation, or deep breathing.   Lymphadenopathy:    He has no cervical adenopathy.  Neurological: He is alert and oriented to person, place, and time. He has normal strength. Gait normal.  Skin: Skin is warm. No erythema.  Psychiatric: His mood appears anxious. Cognition and memory are normal.  Well groomed, good eye contact.    ASSESSMENT AND PLAN:   Nathan Parrish was seen today for chest pain.  Orders Placed This Encounter  Procedures  . EKG 12-Lead    Chest discomfort  We discussed possible etiologies, it seems to be musculoskeletal.  Explained that the probability of this being cardiac is low but never 0, he has no significant CV risk factors.  EKG today: Sinus bradycardia, normal axis and intervals, early repolarization variant.  When compared with EKG done on 06/01/17 no significant changes except for improvement of bradycardia.  Instructed about warning signs. Appointment with cardiologist, 07/22/17.  -     EKG 12-Lead  Bradycardia, sinus Still present but improved when compared with EKG done in 05/2017. I do not think the chest pain is related to this problem. He has an appointment with cardiologist in 07/2017, strongly recommend keeping it.     -Nathan Parrish was advised to seek immediate medical attention if sudden worsening symptoms or to follow if they persist or if new concerns arise.       Betty G. [place], MD  [facility]. [place] office.

## 2017-07-02 ENCOUNTER — Encounter: Payer: Self-pay | Admitting: Family Medicine

## 2017-07-02 ENCOUNTER — Ambulatory Visit: Payer: BLUE CROSS/BLUE SHIELD | Admitting: Family Medicine

## 2017-07-02 VITALS — BP 94/64 | HR 53 | Temp 97.9°F | Resp 12 | Ht 68.0 in | Wt 196.2 lb

## 2017-07-02 DIAGNOSIS — R0789 Other chest pain: Secondary | ICD-10-CM

## 2017-07-02 DIAGNOSIS — R001 Bradycardia, unspecified: Secondary | ICD-10-CM | POA: Diagnosis not present

## 2017-07-02 NOTE — Assessment & Plan Note (Signed)
Still present but improved when compared with EKG done in 05/2017. I do not think the chest pain is related to this problem. He has an appointment with cardiologist in 07/2017, strongly recommend keeping it.

## 2017-07-02 NOTE — Patient Instructions (Addendum)
A few things to remember from today's visit:   Chest discomfort - Plan: EKG 12-Lead  Yo no creo que el dolor en le pecho es relacionado con el corazon per se va para urgencias si empeora or se asocia con dificultad para respirar,sudor,o palpitaciones.   Please be sure medication list is accurate. If a new problem present, please set up appointment sooner than planned today.

## 2017-07-22 ENCOUNTER — Ambulatory Visit: Payer: Self-pay | Admitting: Cardiology

## 2017-07-23 ENCOUNTER — Encounter: Payer: Self-pay | Admitting: *Deleted

## 2017-08-06 ENCOUNTER — Ambulatory Visit: Payer: BLUE CROSS/BLUE SHIELD | Admitting: Family Medicine

## 2017-08-06 ENCOUNTER — Encounter: Payer: Self-pay | Admitting: Family Medicine

## 2017-08-06 VITALS — BP 120/68 | HR 65 | Temp 97.9°F | Resp 12 | Ht 68.0 in | Wt 196.0 lb

## 2017-08-06 DIAGNOSIS — R1031 Right lower quadrant pain: Secondary | ICD-10-CM

## 2017-08-06 DIAGNOSIS — R040 Epistaxis: Secondary | ICD-10-CM | POA: Diagnosis not present

## 2017-08-06 DIAGNOSIS — J309 Allergic rhinitis, unspecified: Secondary | ICD-10-CM | POA: Diagnosis not present

## 2017-08-06 LAB — POCT URINALYSIS DIPSTICK
BILIRUBIN UA: NEGATIVE
Blood, UA: NEGATIVE
GLUCOSE UA: NEGATIVE
KETONES UA: NEGATIVE
Leukocytes, UA: NEGATIVE
Nitrite, UA: NEGATIVE
Odor: NEGATIVE
Protein, UA: NEGATIVE
SPEC GRAV UA: 1.02 (ref 1.010–1.025)
Urobilinogen, UA: 0.2 E.U./dL
pH, UA: 6 (ref 5.0–8.0)

## 2017-08-06 LAB — CBC
HEMATOCRIT: 39.3 % (ref 39.0–52.0)
Hemoglobin: 13.7 g/dL (ref 13.0–17.0)
MCHC: 34.8 g/dL (ref 30.0–36.0)
MCV: 90.2 fl (ref 78.0–100.0)
Platelets: 206 10*3/uL (ref 150.0–400.0)
RBC: 4.35 Mil/uL (ref 4.22–5.81)
RDW: 13.1 % (ref 11.5–15.5)
WBC: 4.6 10*3/uL (ref 4.0–10.5)

## 2017-08-06 NOTE — Patient Instructions (Addendum)
A few things to remember from today's visit:   Right lower quadrant abdominal pain - Plan: POC Urinalysis Dipstick, CBC  Frequent epistaxis - Plan: CBC  Allergic rhinitis, unspecified seasonality, unspecified trigger Puede ser agravado por Flonase.   Hemorragia nasal en los adultos (Nosebleed, Adult) Cuando hay hemorragia nasal, sale sangre de la [place]. Las hemorragias nasales son frecuentes. Por lo general, no son un signo de una afeccin grave. Puede haber una hemorragia nasal cuando un pequeo vaso sanguneo de la nariz comienza a Geophysicist/field seismologist o si el recubrimiento de la nariz (membrana mucosa) se agrieta. Las causas frecuentes de las hemorragias nasales pueden ser las siguientes:  Environmental consultant.  Resfros.  Hurgarse la nariz.  Sonarse la nariz con [employer].  Una lesin por meterse un objeto en la nariz o recibir un golpe en la Clinical cytogeneticist.  Aire seco o fro. Algunas causas menos frecuentes de las hemorragias nasales incluyen lo siguiente:  Gases txicos.  Algo anormal en la nariz o en los espacios llenos de aire en los huesos de la cara (senos).  Crecimientos en la nariz, como plipos.  Medicamentos o afecciones que enlentecen la coagulacin de la [place].  Ciertas enfermedades o procedimientos que irritan o secan las fosas nasales. INSTRUCCIONES PARA EL CUIDADO EN EL HOGAR Si tiene una hemorragia nasal:  Sintese e incline la cabeza ligeramente hacia adelante.  Utilice una toalla o un pauelo de papel limpios para apretar los orificios nasales debajo de la parte sea de la [place]. Despus de , suelte la Darene Lamer y compruebe si vuelve a Geophysicist/field seismologist. No quite la presin antes de ese tiempo. Si an hay hemorragia, repita la presin y sostenga durante hasta que la hemorragia se detenga.  No coloque pauelos de papel ni gasa en la nariz para detener la hemorragia.  Evite recostarse e inclinar la IT trainer atrs. Eso puede provocar una acumulacin de sangre en  la garganta y producir ahogo o tos.  Use un aerosol nasal descongestivo para aliviar una hemorragia nasal como se lo haya indicado el mdico.  No se ponga vaselina ni aceite de vaselina en la nariz. Estos productos pueden gotear dentro de los pulmones. Despus de una hemorragia nasal:  Evite sonarse la nariz u olfatear durante unas cuantas horas.  No haga esfuerzos, no levante objetos y no doble la cintura para Physicist, medical. Puede retomar otras actividades normales tan pronto como pueda.  Utilice un aerosol salino o un humidificador como se lo haya indicado el mdico.  La aspirina y los anticoagulantes aumentan la probabilidad de hemorragias. Si les recetan estos medicamentos y sufre de hemorragias nasales: ? Pregntele al mdico si debe dejar de tomar los medicamentos o si debe ajustar la dosis. ? No deje de tomar los medicamentos recomendados por el mdico salvo que este le indique lo contrario.  Si la hemorragia nasal se debe a la sequedad de [employer], use un gel o aerosol nasal de solucin salina de [employer]. Esto mantendr las mucosas hmedas y permitir que se Midwife. Si debe usar un lubricante: ? Elija uno que sea soluble en agua. ? Use solamente la cantidad que necesita y con la frecuencia necesaria. ? No se recueste hasta que hayan transcurrido varias horas despus de usarlo. SOLICITE ATENCIN MDICA SI:  Lance Muss.  Tiene hemorragias nasales frecuentes o con mayor frecuencia que lo habitual.  Aparecen hematomas con mucha facilidad.  Tiene una hemorragia nasal por tener algo metido en la nariz.  Tiene sangrado en la  boca.  Vomita o libera una sustancia marrn al toser.  Tiene una hemorragia nasal despus de comenzar un medicamento nuevo. SOLICITE ATENCIN MDICA DE INMEDIATO SI:  Tiene una hemorragia nasal despus de una cada o una lesin en la cabeza.  Tiene una hemorragia nasal que no desaparece despus de  .  Siente que se desvanece o est dbil.  Tiene hemorragias fuera de lo comn en otras partes del cuerpo.  Tiene hematomas fuera de lo comn en otras partes del cuerpo.  Tiene sudoracin.  Vomita sangre. Esta informacin no tiene Theme park manager el consejo del mdico. Asegrese de hacerle al mdico cualquier pregunta que tenga. Document Released: 12/17/2004 Document Revised: 03/30/2014 Document Reviewed: 09/24/2015 Elsevier Interactive Patient Education  2018 [employer].  Please be sure medication list is accurate. If a new problem present, please set up appointment sooner than planned today.

## 2017-08-06 NOTE — Progress Notes (Signed)
ACUTE VISIT   HPI:  Chief Complaint  Patient presents with  . Epistaxis    mainly left nostril for the ;ast week  . Abdominal Pain    right lower side, started 3 or 4 days ago off and on    Nathan Parrish is a 42 y.o. male, who is here today complaining of episodes of nose bleed when he blows his nose,intermittently Problem started 3-4 days ago. No Hx of trauma. No gum bleeding,easy bruising, gross hematuria,blood in stool,or enlarged glands. No fever, chills,night sweats,or abnormal wt loss. He has not tried treatments.  Hx of allergic rhinitis,he is on Flonase nasal spray.   Also c/o 4 days of LLQ abdominal pain, he has had this pain intermittent for a while. It is mild,no radiated. He dull,2/10.  Denies associated nausea, vomiting, changes in bowel habits, blood in stool. No urinary symptoms. He has not identified exacerbating or alleviating factors.    Review of Systems  Constitutional: Negative for appetite change, fatigue, fever and unexpected weight change.  HENT: Positive for congestion and nosebleeds. Negative for facial swelling, mouth sores, sinus pain, sore throat and trouble swallowing.   Eyes: Negative for redness and visual disturbance.  Respiratory: Negative for cough, shortness of breath and wheezing.   Cardiovascular: Negative for palpitations and leg swelling.  Gastrointestinal: Positive for abdominal pain. Negative for blood in stool, nausea and vomiting.       No changes in bowel habits.  Genitourinary: Negative for decreased urine volume, dysuria and hematuria.  Musculoskeletal: Negative for back pain and myalgias.  Skin: Negative for pallor and rash.  Allergic/Immunologic: Positive for environmental allergies.  Neurological: Negative for syncope, weakness and headaches.  Hematological: Negative for adenopathy. Does not bruise/bleed easily.  Psychiatric/Behavioral: Negative for confusion. The patient is not nervous/anxious.        Current Outpatient Medications on File Prior to Visit  Medication Sig Dispense Refill  . CALCIUM PO Take by mouth.    . fluticasone (FLONASE) 50 MCG/ACT nasal spray Place 1 spray into both nostrils 2 (two) times daily. 16 g 6   No current facility-administered medications on file prior to visit.      Past Medical History:  Diagnosis Date  . Allergy    No Known Allergies  Social History   Socioeconomic History  . Marital status: Married    Spouse name: Not on file  . Number of children: Not on file  . Years of education: Not on file  . Highest education level: Not on file  Occupational History  . Not on file  Social Needs  . Financial resource strain: Not on file  . Food insecurity:    Worry: Not on file    Inability: Not on file  . Transportation needs:    Medical: Not on file    Non-medical: Not on file  Tobacco Use  . Smoking status: Never Smoker  . Smokeless tobacco: Never Used  Substance and Sexual Activity  . Alcohol use: Yes  . Drug use: No  . Sexual activity: Yes    Partners: Female  Lifestyle  . Physical activity:    Days per week: Not on file    Minutes per session: Not on file  . Stress: Not on file  Relationships  . Social connections:    Talks on phone: Not on file    Gets together: Not on file    Attends religious service: Not on file    Active member of  club or organization: Not on file    Attends meetings of clubs or organizations: Not on file    Relationship status: Not on file  Other Topics Concern  . Not on file  Social History Narrative   ** Merged History Encounter **        Vitals:   08/06/17 1432  BP: 120/68  Pulse: 65  Resp: 12  Temp: 97.9 F (36.6 C)  SpO2: 97%   Body mass index is 29.8 kg/m.   Physical Exam  Nursing note and vitals reviewed. Constitutional: He is oriented to person, place, and time. He appears well-developed. No distress.  HENT:  Head: Normocephalic and atraumatic.  Nose: No mucosal  edema or nasal septal hematoma.  Mouth/Throat: Oropharynx is clear and moist and mucous membranes are normal.  Hyperemic nasal mucosa. On septum small thrombus right side.No active bleeding.   Eyes: Pupils are equal, round, and reactive to light. Conjunctivae are normal.  Cardiovascular: Normal rate and regular rhythm.  No murmur heard. Respiratory: Effort normal and breath sounds normal. No respiratory distress.  GI: Soft. He exhibits no mass. There is no hepatomegaly. There is no tenderness.  Musculoskeletal: He exhibits no edema.  Lymphadenopathy:    He has no cervical adenopathy.  Neurological: He is alert and oriented to person, place, and time. He has normal strength.  Skin: Skin is warm. No erythema.  Psychiatric: He has a normal mood and affect.      ASSESSMENT AND PLAN:   Nathan Parrish was seen today for epistaxis and abdominal pain.  Diagnoses and all orders for this visit:  Lab Results  Component Value Date   WBC 4.6 08/06/2017   HGB 13.7 08/06/2017   HCT 39.3 08/06/2017   MCV 90.2 08/06/2017   PLT 206.0 08/06/2017     Frequent epistaxis  Possible causes discussed: Dryness,allergic rhinitis,and intranasal steroid among some. Hx does not suggest a serious process. Problem aggravated by blowing his nose, recommend avoiding to do so.  -     CBC  Right lower quadrant abdominal pain  Abdominal examination negative. Urine dipstick negative.  ? Muscular. I do not think imaging is needed today.  Instructed about warning signs.   -     POC Urinalysis Dipstick -     CBC  Allergic rhinitis, unspecified seasonality, unspecified trigger  Side effects of Flonase nasal spray discussed. Nasal irrigations with saline recommended.        Betty G. Swaziland, MD  [facility]. [place] [facility].

## 2017-08-07 ENCOUNTER — Encounter: Payer: Self-pay | Admitting: Family Medicine

## 2017-08-09 NOTE — Progress Notes (Deleted)
    Referring-Betty Swaziland MD Reason for referral-Bradycardia and chest pain.   HPI: 42 yo male for evaluation of bradycardia chest pain at request of Betty Swaziland MD.   Current Outpatient Medications  Medication Sig Dispense Refill  . CALCIUM PO Take by mouth.    . fluticasone (FLONASE) 50 MCG/ACT nasal spray Place 1 spray into both nostrils 2 (two) times daily. 16 g 6   No current facility-administered medications for this visit.     No Known Allergies  Past Medical History:  Diagnosis Date  . Allergy     No past surgical history on file.  Social History   Socioeconomic History  . Marital status: Married    Spouse name: Not on file  . Number of children: Not on file  . Years of education: Not on file  . Highest education level: Not on file  Occupational History  . Not on file  Social Needs  . Financial resource strain: Not on file  . Food insecurity:    Worry: Not on file    Inability: Not on file  . Transportation needs:    Medical: Not on file    Non-medical: Not on file  Tobacco Use  . Smoking status: Never Smoker  . Smokeless tobacco: Never Used  Substance and Sexual Activity  . Alcohol use: Yes  . Drug use: No  . Sexual activity: Yes    Partners: Female  Lifestyle  . Physical activity:    Days per week: Not on file    Minutes per session: Not on file  . Stress: Not on file  Relationships  . Social connections:    Talks on phone: Not on file    Gets together: Not on file    Attends religious service: Not on file    Active member of club or organization: Not on file    Attends meetings of clubs or organizations: Not on file    Relationship status: Not on file  . Intimate partner violence:    Fear of current or ex partner: Not on file    Emotionally abused: Not on file    Physically abused: Not on file    Forced sexual activity: Not on file  Other Topics Concern  . Not on file  Social History Narrative   ** Merged History Encounter **         Family History  Problem Relation Age of Onset  . Hypertension Mother   . Hypertension Father   . Asthma Sister   . Asthma Sister   . Asthma Sister     ROS: no fevers or chills, productive cough, hemoptysis, dysphasia, odynophagia, melena, hematochezia, dysuria, hematuria, rash, seizure activity, orthopnea, PND, pedal edema, claudication. Remaining systems are negative.  Physical Exam:   There were no vitals taken for this visit.  General:  Well developed/well nourished in NAD Skin warm/dry Patient not depressed No peripheral clubbing Back-normal HEENT-normal/normal eyelids Neck supple/normal carotid upstroke bilaterally; no bruits; no JVD; no thyromegaly chest - CTA/ normal expansion CV - RRR/normal S1 and S2; no murmurs, rubs or gallops;  PMI nondisplaced Abdomen -NT/ND, no HSM, no mass, + bowel sounds, no bruit 2+ femoral pulses, no bruits Ext-no edema, chords, 2+ DP Neuro-grossly nonfocal  ECG - personally reviewed  A/P  1  Olga Millers, MD

## 2017-08-10 ENCOUNTER — Ambulatory Visit: Payer: BLUE CROSS/BLUE SHIELD | Admitting: Cardiology

## 2017-08-11 ENCOUNTER — Telehealth: Payer: Self-pay | Admitting: Family Medicine

## 2017-08-11 NOTE — Telephone Encounter (Signed)
Patient's son calling back for patient's lab results. Please advise. May need a translator if son, Daphine Deutscher, is not available. CB#: [phone]  Copied from CRM [id number]. Topic: Quick Communication - Lab Results >> Aug 09, 2017 11:18 AM Jobe Gibbon, CMA wrote: Called patient to inform them of their lab results. When patient returns call, triage nurse may disclose results.

## 2017-08-11 NOTE — Telephone Encounter (Signed)
Attempted to contact patient regarding lab results, no answer and no voicemail set up.

## 2017-12-30 ENCOUNTER — Ambulatory Visit: Payer: Self-pay | Admitting: *Deleted

## 2017-12-30 NOTE — Telephone Encounter (Signed)
Noted  

## 2017-12-30 NOTE — Telephone Encounter (Signed)
Pt's daughter Raeanne Gathers called stating that he has been having right testicle pain about a week ago; he says it is "stable pain" that hurts all of the time; he also says that he has pain in his lower right groin to his mid thigh; Raeanne Gathers says that the pt had pain about 6 months ago but it stopped; recommendations made per nurse triage protocol however pt would like to be seen in he office instead; spoke with [facility] for approval; pt/daughter offered and accepted appointment with Dr Betty Swaziland, [facility], 12/31/17 at 1500; will route to office for notification of this upsoming appointment.  Reason for Disposition . [1] Constant pain in scrotum or testicle AND [2] present > 1 hour  Answer Assessment - Initial Assessment Questions 1. LOCATION and RADIATION: "Where is the pain located?"      Right testicle 2. QUALITY: "What does the pain feel like?"  (e.g., sharp, dull, aching, burning)     "stable pain" 3. SEVERITY: "How bad is the pain?"  (Scale 1-10; or mild, moderate, severe)   - MILD (1-3): doesn't interfere with normal activities    - MODERATE (4-7): interferes with normal activities (e.g., work or school) or awakens from sleep   - SEVERE (8-10): excruciating pain, unable to do any normal activities, difficulty walking     moderate 4. ONSET: "When did the pain start?"     12/23/17 5. PATTERN: "Does it come and go, or has it been constant since it started?"     constant 6. SCROTAL APPEARANCE: "What does the scrotum look like?" "Is there any swelling or redness?"      No swelling or redness 7. HERNIA: "Has a doctor ever told you that you have a hernia?"     Not sure 8. OTHER SYMPTOMS: "Do you have any other symptoms?" (e.g., fever, abdominal pain, vomiting, difficulty passing urine)     Lower right groin pain to his mid thigh  Protocols used: SCROTAL PAIN-A-AH

## 2017-12-31 ENCOUNTER — Encounter: Payer: Self-pay | Admitting: Family Medicine

## 2017-12-31 ENCOUNTER — Ambulatory Visit: Payer: BLUE CROSS/BLUE SHIELD | Admitting: Family Medicine

## 2017-12-31 VITALS — BP 120/84 | HR 63 | Temp 97.8°F | Resp 12 | Ht 68.0 in | Wt 193.0 lb

## 2017-12-31 DIAGNOSIS — N50811 Right testicular pain: Secondary | ICD-10-CM | POA: Diagnosis not present

## 2017-12-31 DIAGNOSIS — Z23 Encounter for immunization: Secondary | ICD-10-CM

## 2017-12-31 NOTE — Progress Notes (Signed)
ACUTE VISIT   HPI:  Chief Complaint  Patient presents with  . Right testicle pain    started 1 week ago    [name] is a 42 y.o. male, who is here today complaining of right testicular pain that has been going on intermittently for years. Started again 12/20/17.  Dull pain,constant, 2/10,no radiated. He is not sure about exacerbating or alleviating factors.  He thinks it may be caused by wearing tight jeans. No Hx of trauma. He has not noted edema or erythema. It happens about twice per year.   According to pt, years ago he was evaluated in [place] for same problem and "small knots" noted in left testicle.  Denies fever,chills,abdominal pain,nasuea,vomiting,or urinary symptoms.  He has not tried OTC meds or home remedies.  Problem has been stable for year. Today pain is better.   Review of Systems  Constitutional: Negative for appetite change, fatigue and fever.  HENT: Negative for mouth sores and trouble swallowing.   Gastrointestinal: Negative for abdominal pain, nausea and vomiting.       No changes in bowel habits.  Genitourinary: Positive for testicular pain. Negative for decreased urine volume, discharge, dysuria, flank pain, frequency, hematuria, scrotal swelling and urgency.  Musculoskeletal: Negative for back pain and myalgias.  Skin: Negative for rash.  Hematological: Negative for adenopathy. Does not bruise/bleed easily.  Psychiatric/Behavioral: Negative for sleep disturbance. The patient is nervous/anxious.       Current Outpatient Medications on File Prior to Visit  Medication Sig Dispense Refill  . CALCIUM PO Take by mouth.    . fluticasone (FLONASE) 50 MCG/ACT nasal spray Place 1 spray into both nostrils 2 (two) times daily. 16 g 6   No current facility-administered medications on file prior to visit.      Past Medical History:  Diagnosis Date  . Allergy    No Known Allergies  Social History   Socioeconomic History    . Marital status: Married    Spouse name: Not on file  . Number of children: Not on file  . Years of education: Not on file  . Highest education level: Not on file  Occupational History  . Not on file  Social Needs  . Financial resource strain: Not on file  . Food insecurity:    Worry: Not on file    Inability: Not on file  . Transportation needs:    Medical: Not on file    Non-medical: Not on file  Tobacco Use  . Smoking status: Never Smoker  . Smokeless tobacco: Never Used  Substance and Sexual Activity  . Alcohol use: Yes  . Drug use: No  . Sexual activity: Yes    Partners: Female  Lifestyle  . Physical activity:    Days per week: Not on file    Minutes per session: Not on file  . Stress: Not on file  Relationships  . Social connections:    Talks on phone: Not on file    Gets together: Not on file    Attends religious service: Not on file    Active member of club or organization: Not on file    Attends meetings of clubs or organizations: Not on file    Relationship status: Not on file  Other Topics Concern  . Not on file  Social History Narrative   ** Merged History Encounter **        Vitals:   12/31/17 1456  BP: 120/84  Pulse: 63  Resp: 12  Temp: 97.8 F (36.6 C)  SpO2: 97%   Body mass index is 29.35 kg/m.   Physical Exam  Nursing note and vitals reviewed. Constitutional: He is oriented to person, place, and time. He appears well-developed. No distress.  HENT:  Head: Normocephalic and atraumatic.  Eyes: Conjunctivae are normal.  Cardiovascular: Normal rate and regular rhythm.  No murmur heard. Respiratory: Effort normal and breath sounds normal. No respiratory distress.  GI: Soft. He exhibits no mass. There is no hepatomegaly. There is no tenderness. There is no CVA tenderness.  Genitourinary: Right testis shows no mass, no swelling and no tenderness. Left testis shows no mass, no swelling and no tenderness.  Genitourinary Comments: He did not  want chaperone during examination. ? Left varicocele.   Musculoskeletal: He exhibits no edema.  Neurological: He is alert and oriented to person, place, and time. He has normal strength.  Skin: Skin is warm. No erythema.  Psychiatric: His mood appears anxious.  Well-groomed, good eye contact.      ASSESSMENT AND PLAN:   [name] was seen today for right testicle pain.  Diagnoses and all orders for this visit:  Testicular pain, right -     US Scrotum; Future -     Cancel: Urinalysis, Routine w reflex microscopic -     Urinalysis, Routine w reflex microscopic  Need for immunization against influenza -     Flu Vaccine QUAD 36+ mos IM    We discussed possible etiologies of testicular pain. History and physical examination today do not suggest a serious process. ?  Varicocele. Clearly instructed about warning signs. Further recommendation will be given according to testicular US.    Return if symptoms worsen or fail to improve.     Tekeisha Hakim G. [place]il[place], MD  [facility]. [place] [facility].

## 2017-12-31 NOTE — Patient Instructions (Signed)
A few things to remember from today's visit:   Testicular pain, right - Plan: US Scrotum, Urinalysis, Routine w reflex microscopic   Autoexamen testicular (Testicular Self-Exam) El autoexamen de los testculos consiste en la observacin y palpacin de los testculos para buscar ndulos anormales o hinchazn. Hay varios factores que pueden causar hinchazn, bultos o dolor en los testculos. Entre ellos se incluyen:  Lesiones.  Hinchazn, enrojecimiento y dolor (inflamacin).  Infeccin.  Lquido alrededor [employer].  Torsin testicular.  Cncer de testculo. Los testculos son ms fciles de examinar despus de un bao o una ducha calientes. Es ms difcil examinarlos cuando tiene fro. Siga estos pasos mientras est de pie:  Sostenga el pene separado del cuerpo.  Haga rodar el testculo entre el pulgar y el ndice. Palpe todo el testculo.  Haga rodar el otro testculo entre el pulgar y el ndice. Palpe todo el testculo. Sienta la presencia de bultos, hinchazn o molestias. Un testculo normal tiene la forma de un huevo. Se siente firme. Es blando y no duele. El cordn espermtico se siente como una cuerda o espagueti. Se encuentra en la parte posterior del testculo. Examine el pliegue que se encuentra entre el frente de la pierna y el abdomen. Palpe si hay bultos que le duelen. Podra tratarse de ganglios linfticos agrandados. Esta informacin no tiene Theme park manager el consejo del mdico. Asegrese de hacerle al mdico cualquier pregunta que tenga. Document Released: 04/11/2010 Document Revised: 12/28/2012 Document Reviewed: 08/29/2012 Elsevier Interactive Patient Education  2017 Elsevier Inc.  Please be sure medication list is accurate. If a new problem present, please set up appointment sooner than planned today.

## 2017-12-31 NOTE — Progress Notes (Signed)
At check out, patient requested that we please call patient's family member, Evie Lacks, at [phone] to schedule [place]. Patient does not speak [place] and requests that we relay scheduling info through [place]. Ok to leave a detailed message and Evie Lacks will inform patient. Staff message sent to inform referral coordinator.

## 2018-01-01 LAB — URINALYSIS, ROUTINE W REFLEX MICROSCOPIC
BILIRUBIN URINE: NEGATIVE
GLUCOSE, UA: NEGATIVE
Hgb urine dipstick: NEGATIVE
Ketones, ur: NEGATIVE
Leukocytes, UA: NEGATIVE
Nitrite: NEGATIVE
PH: 5.5 (ref 5.0–8.0)
Protein, ur: NEGATIVE
SPECIFIC GRAVITY, URINE: 1.015 (ref 1.001–1.03)

## 2018-08-12 ENCOUNTER — Encounter: Payer: BLUE CROSS/BLUE SHIELD | Admitting: Family Medicine

## 2019-01-04 IMAGING — CT CT T SPINE W/O CM
3 of 12 series · 6 of 33 positions shown, 7 images · non-contrast
Comparison: Cervical spine CT today reported separately. Portable
chest x-ray 3393 hours.

CLINICAL DATA: 41-year-old male who was cutting down a tree and
fell from the ladder onto a chainsaw. Lacerations and pain.

EXAM:
CT THORACIC SPINE WITHOUT CONTRAST
TECHNIQUE: Multidetector CT images of the thoracic were obtained using the
standard protocol without intravenous contrast.

[Series 10: c_spine 2.0 sag bone · sagittal · 0.33mm/px · 2 of 61 slices shown]
[im 21/61  bone]
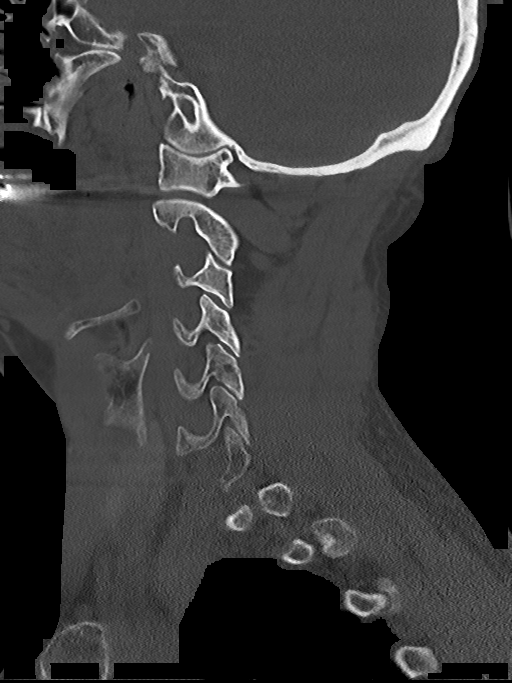
[im 41/61  bone]
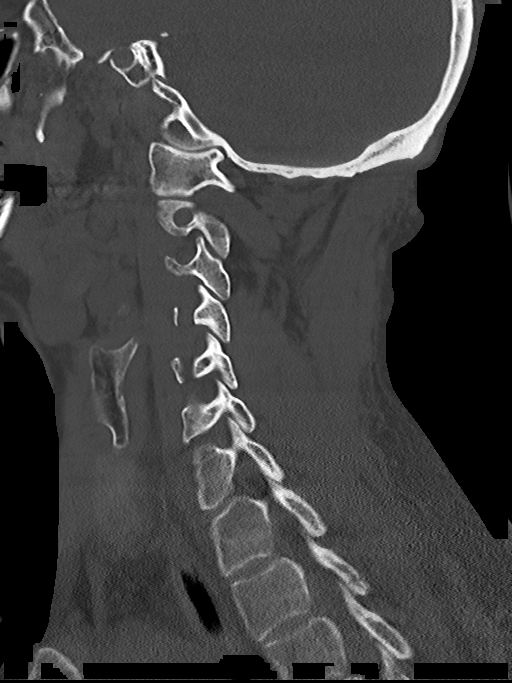

[Series 16: t-spine 2.0 st · axial · 0.38mm/px · z∈[-494,-374]mm · 2 of 182 slices shown, 3 images]
[im 61/182  soft-tissue]
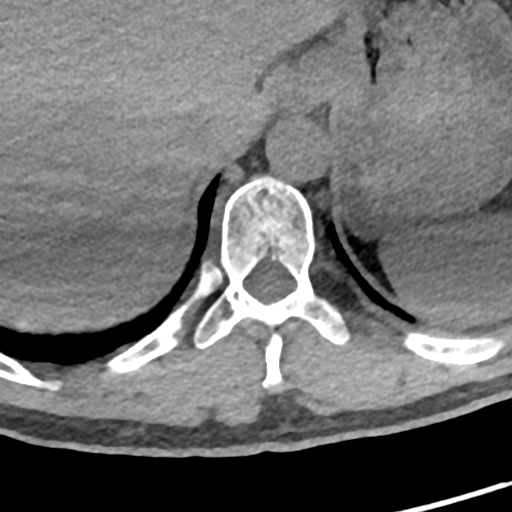
[im 61/182  bone]
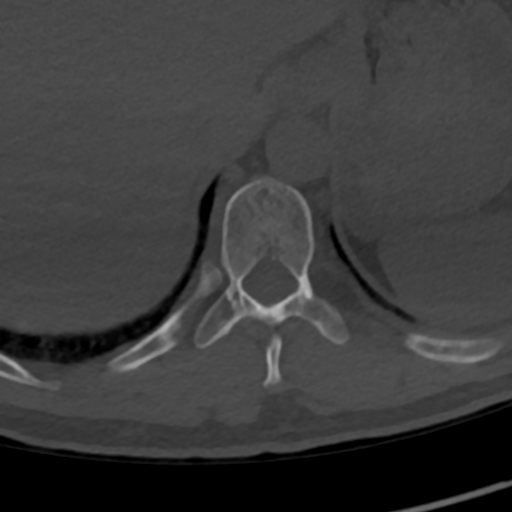
[im 121/182  bone]
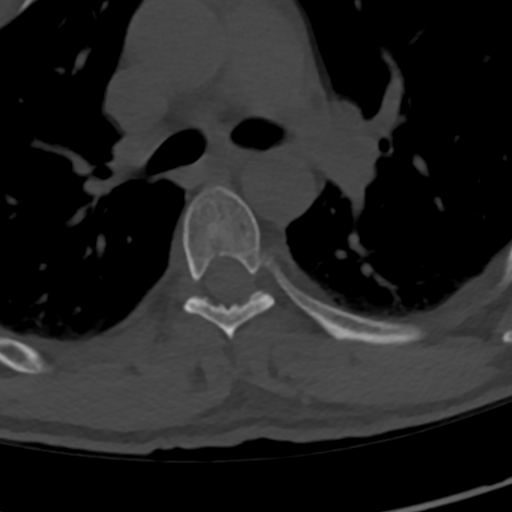

[Series 21: t-spine 2.0 orthogonal · axial · 0.21mm/px · z∈[-494,-374]mm · 2 of 193 slices shown]
[im 65/193  bone]
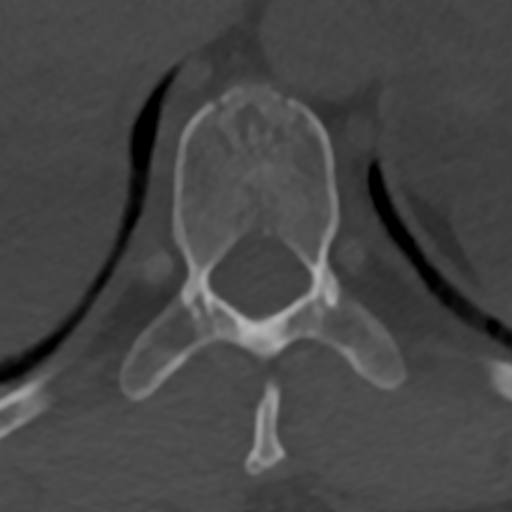
[im 129/193  bone]
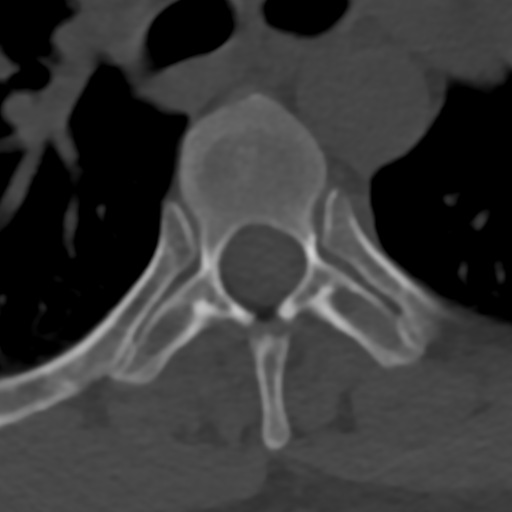

[6 of 33 positions shown; findings below may reference images not displayed]

FINDINGS: Alignment: Cervicothoracic junction alignment is within normal
limits. Preserved thoracic vertebral height and alignment.

Vertebrae: Thoracic vertebrae appear intact.

Nondisplaced left posterior first rib fracture. The visible left
second rib is intact. A posterior left third rib fracture is
displaced by 1 shaft with (series 17, image 33). Nondisplaced left
posterior rib fracture (image 45). The other visible left ribs are
intact. The posterior right ribs are intact.

Paraspinal and other soft tissues: No left pneumothorax or definite
pleural effusion. Dependent pulmonary atelectasis. No pericardial
effusion. Negative visualized noncontrast mediastinal and upper
abdominal soft tissues. Negative visualized posterior paraspinal
soft tissues.

Disc levels: No evidence of thoracic spinal stenosis.
IMPRESSION: 1. Multilevel left rib fractures, including displaced fracture of
the posterior left rib. No pneumothorax or hemothorax is visible.
2.  No acute osseous abnormality identified in the thoracic spine.
3. Head and Cervical Spine CT today reported separately

## 2019-01-04 IMAGING — DX DG SCAPULA*L*
2 series · 2 of 2 positions shown · non-contrast
Comparison: None.

CLINICAL DATA: 41 y/o M; fall from 12 foot ladder. Left shoulder
pain.

EXAM:
LEFT SCAPULA - 2+ VIEWS

[x scapula y-view left (1 of 2)]
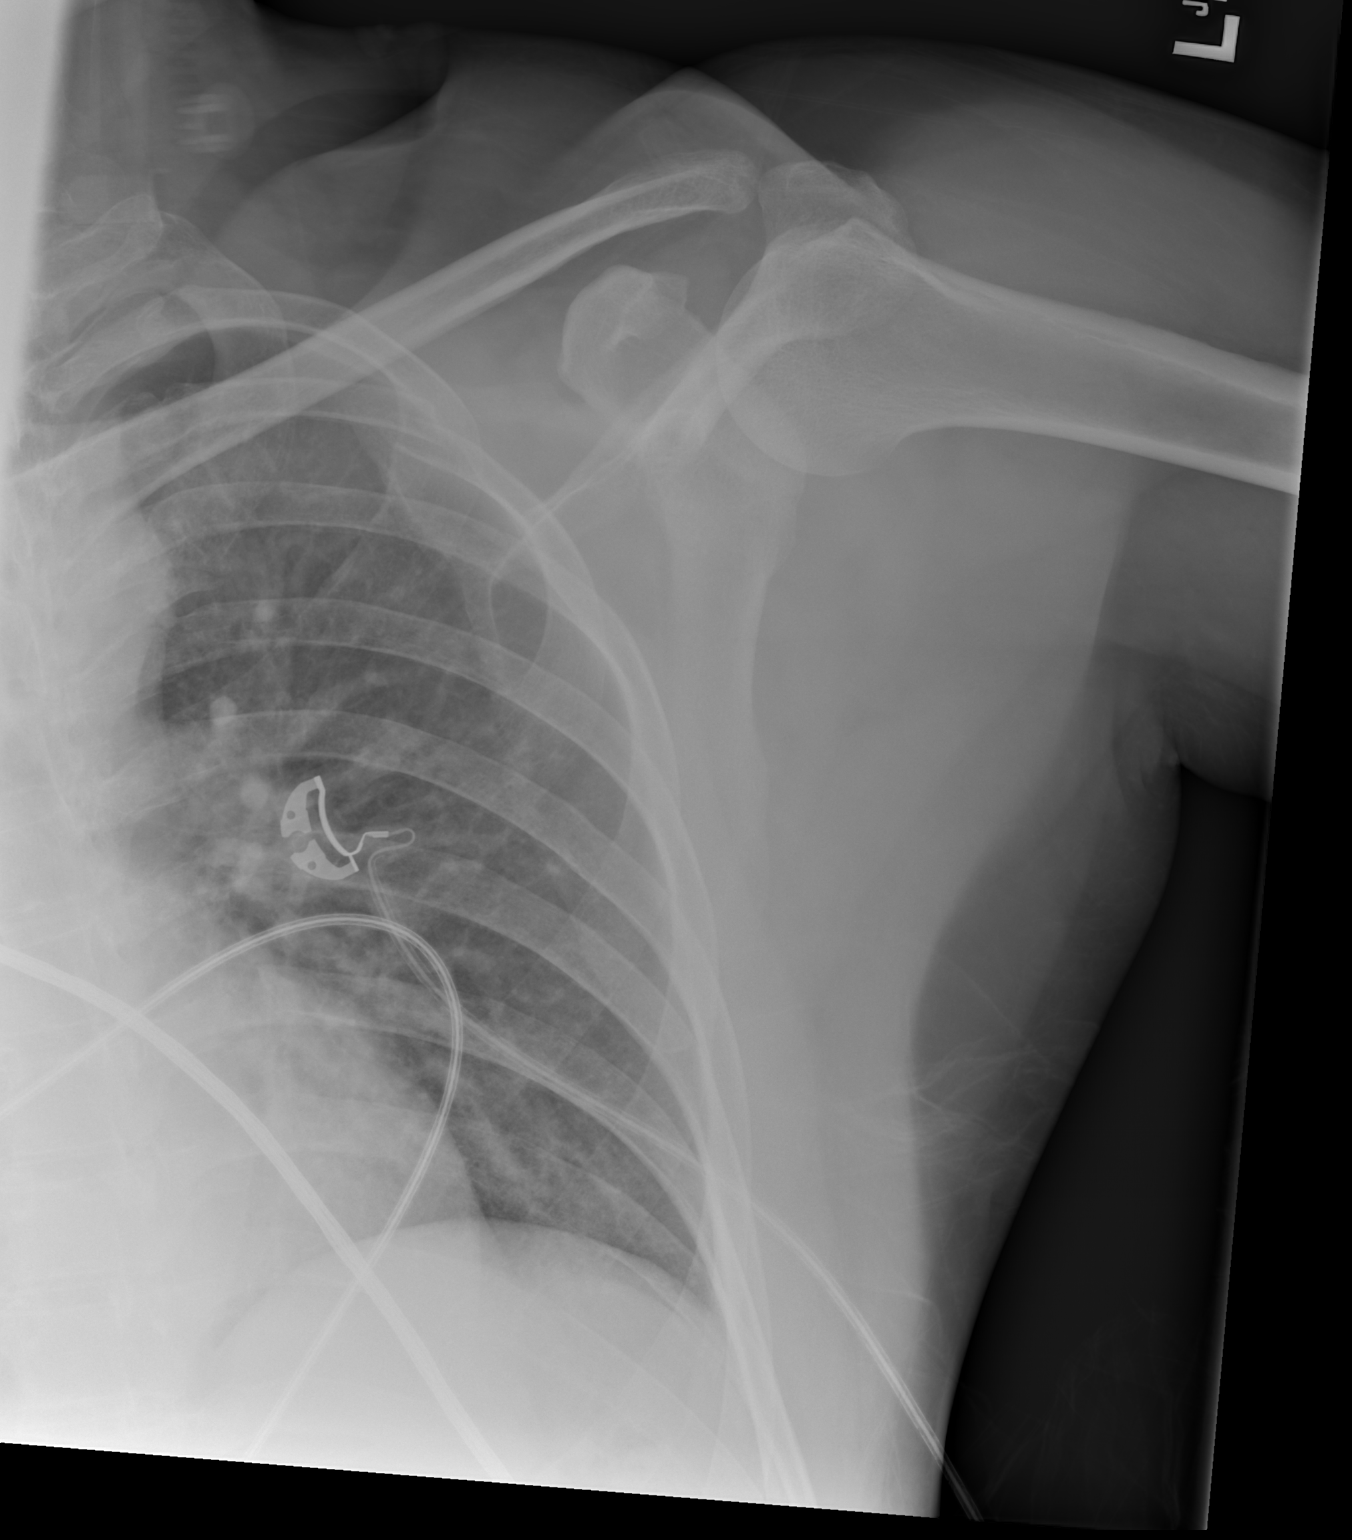

[x scapula y-view left (2 of 2)]
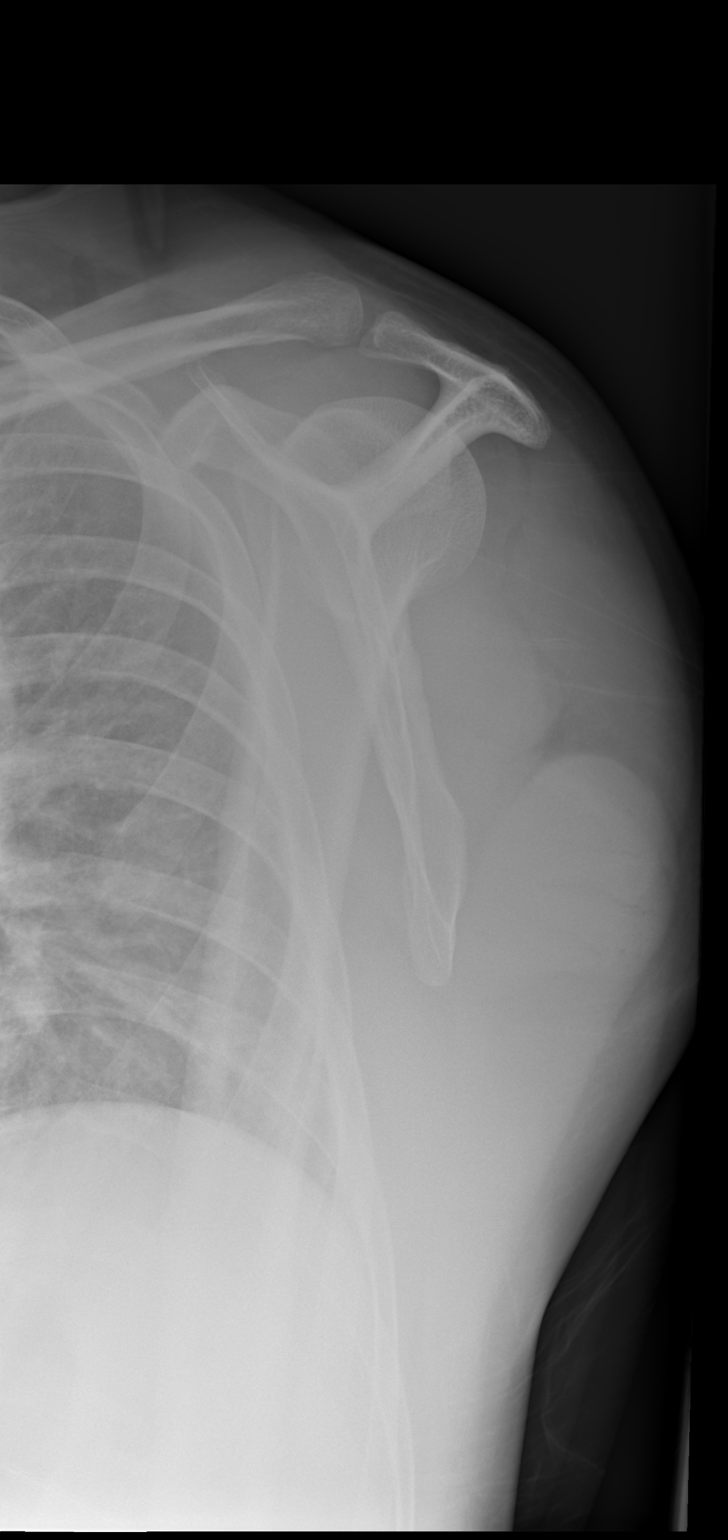

[2 of 2 positions shown; findings below may reference images not displayed]

FINDINGS: Left posterior third rib acute displaced fracture and left posterior
fourth rib nondisplaced fracture. No other fracture or dislocation
identified.
IMPRESSION: Left posterior third rib acute displaced fracture and left posterior
fourth rib acute nondisplaced fracture. No other fracture or
dislocation identified.

By: Thesaying Mazzonick M.D.

## 2019-01-23 ENCOUNTER — Telehealth: Payer: Self-pay | Admitting: Family Medicine

## 2019-02-02 ENCOUNTER — Ambulatory Visit: Payer: BLUE CROSS/BLUE SHIELD

## 2019-02-15 ENCOUNTER — Encounter: Payer: BLUE CROSS/BLUE SHIELD | Admitting: Family Medicine

## 2019-02-21 ENCOUNTER — Ambulatory Visit (INDEPENDENT_AMBULATORY_CARE_PROVIDER_SITE_OTHER): Payer: BLUE CROSS/BLUE SHIELD | Admitting: Family Medicine

## 2019-02-21 ENCOUNTER — Encounter: Payer: Self-pay | Admitting: Family Medicine

## 2019-02-21 ENCOUNTER — Other Ambulatory Visit: Payer: Self-pay

## 2019-02-21 VITALS — BP 120/70 | HR 46 | Temp 97.0°F | Resp 12 | Ht 68.0 in | Wt 195.2 lb

## 2019-02-21 DIAGNOSIS — R351 Nocturia: Secondary | ICD-10-CM

## 2019-02-21 DIAGNOSIS — N401 Enlarged prostate with lower urinary tract symptoms: Secondary | ICD-10-CM

## 2019-02-21 DIAGNOSIS — Z13228 Encounter for screening for other metabolic disorders: Secondary | ICD-10-CM

## 2019-02-21 DIAGNOSIS — L301 Dyshidrosis [pompholyx]: Secondary | ICD-10-CM | POA: Diagnosis not present

## 2019-02-21 DIAGNOSIS — Z13220 Encounter for screening for lipoid disorders: Secondary | ICD-10-CM | POA: Diagnosis not present

## 2019-02-21 DIAGNOSIS — R350 Frequency of micturition: Secondary | ICD-10-CM | POA: Diagnosis not present

## 2019-02-21 DIAGNOSIS — Z0000 Encounter for general adult medical examination without abnormal findings: Secondary | ICD-10-CM

## 2019-02-21 DIAGNOSIS — Z23 Encounter for immunization: Secondary | ICD-10-CM | POA: Diagnosis not present

## 2019-02-21 DIAGNOSIS — Z1329 Encounter for screening for other suspected endocrine disorder: Secondary | ICD-10-CM | POA: Diagnosis not present

## 2019-02-21 DIAGNOSIS — Z130 Encounter for screening for diseases of the blood and blood-forming organs and certain disorders involving the immune mechanism: Secondary | ICD-10-CM

## 2019-02-21 DIAGNOSIS — R001 Bradycardia, unspecified: Secondary | ICD-10-CM

## 2019-02-21 LAB — LIPID PANEL
Cholesterol: 197 mg/dL (ref 0–200)
HDL: 53.7 mg/dL (ref >39.00)
LDL Cholesterol: 120 mg/dL — ABNORMAL HIGH (ref 0–99)
NonHDL: 143.15
Total CHOL/HDL Ratio: 4
Triglycerides: 116 mg/dL (ref 0.0–149.0)
VLDL: 23.2 mg/dL (ref 0.0–40.0)

## 2019-02-21 LAB — URINALYSIS, ROUTINE W REFLEX MICROSCOPIC
Bilirubin Urine: NEGATIVE
Hgb urine dipstick: NEGATIVE
Ketones, ur: NEGATIVE
Leukocytes,Ua: NEGATIVE
Nitrite: NEGATIVE
RBC / HPF: NONE SEEN (ref 0–?)
Specific Gravity, Urine: >=1.03 — AB (ref 1.000–1.030)
Total Protein, Urine: NEGATIVE
Urine Glucose: NEGATIVE
Urobilinogen, UA: 0.2 (ref 0.0–1.0)
WBC, UA: NONE SEEN (ref 0–?)
pH: 6 (ref 5.0–8.0)

## 2019-02-21 LAB — BASIC METABOLIC PANEL
BUN: 21 mg/dL (ref 6–23)
CO2: 30 mEq/L (ref 19–32)
Calcium: 9.2 mg/dL (ref 8.4–10.5)
Chloride: 102 mEq/L (ref 96–112)
Creatinine, Ser: 0.91 mg/dL (ref 0.40–1.50)
GFR: 89.86 mL/min (ref >60.00)
Glucose, Bld: 81 mg/dL (ref 70–99)
Potassium: 4.8 mEq/L (ref 3.5–5.1)
Sodium: 139 mEq/L (ref 135–145)

## 2019-02-21 LAB — HEMOGLOBIN A1C: Hgb A1c MFr Bld: 5.3 % (ref 4.6–6.5)

## 2019-02-21 LAB — TSH: TSH: 0.92 u[IU]/mL (ref 0.35–4.50)

## 2019-02-21 LAB — PSA: PSA: 0.81 ng/mL (ref 0.10–4.00)

## 2019-02-21 MED ORDER — CLOBETASOL PROPIONATE 0.05 % EX CREA: 1.0000 "application " | TOPICAL_CREAM | Freq: Two times a day (BID) | CUTANEOUS | 1 refills | Status: DC | PRN

## 2019-02-21 NOTE — Progress Notes (Signed)
HPI:  Nathan Parrish is a 43 y.o.male here today for his routine physical examination.  Last CPE: 06/01/17 He lives with his wife and 5 children.  Regular exercise 3 or more times per week: Not consistently. Following a healthy diet: No  Chronic medical problems: Sinus bradycardia.   Hx of STD's: Negative.  Immunization History  Administered Date(s) Administered  . Influenza,inj,Quad PF,6+ Mos 12/28/2015, 12/31/2017  . Tdap 01/03/2017    -Denies high alcohol intake, tobacco use, or Hx of illicit drug use.  -Concerns and/or follow up today:   Pruritic rash on hands for years but seems to be worse this years. OTC topical helps with pruritus. He is not sure about exacerbating or alleviating factors. 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   Nocturia x 2-3,he has had problem for a while, mentioned it during his last CPE. Denies urinary urgency,decreased urine strain,rectal pain, gross hematuria,dysuria,or incontinence. He drinks "a lot of" milk at bedtime every night.   Review of Systems  Constitutional: Negative for activity change, appetite change, fatigue, fever and unexpected weight change.  HENT: Negative for dental problem, nosebleeds, sore throat and trouble swallowing.   Eyes: Negative for redness and visual disturbance.  Respiratory: Negative for cough, shortness of breath and wheezing.   Cardiovascular: Negative for chest pain, palpitations and leg swelling.  Gastrointestinal: Negative for abdominal pain, blood in stool, nausea and vomiting.  Endocrine: Negative for cold intolerance, heat intolerance, polydipsia, polyphagia and polyuria.  Genitourinary: Negative for decreased urine volume, genital sores and testicular pain.  Musculoskeletal: Negative for gait problem and myalgias.  Skin: Positive for rash. Negative for color change and wound.  Allergic/Immunologic: Negative for environmental allergies.  Neurological: Negative for  syncope, weakness and headaches.  Hematological: Negative for adenopathy. Does not bruise/bleed easily.  Psychiatric/Behavioral: Positive for sleep disturbance. Negative for confusion. The patient is not nervous/anxious.   All other systems reviewed and are negative.   No current outpatient medications on file prior to visit.   No current facility-administered medications on file prior to visit.     Past Medical History:  Diagnosis Date  . Allergy     History reviewed. No pertinent surgical history.  No Known Allergies  Family History  Problem Relation Age of Onset  . Hypertension Mother   . Hypertension Father   . Asthma Sister   . Asthma Sister   . Asthma Sister     Social History   Socioeconomic History  . Marital status: Married    Spouse name: Not on file  . Number of children: Not on file  . Years of education: Not on file  . Highest education level: Not on file  Occupational History  . Not on file  Social Needs  . Financial resource strain: Not on file  . Food insecurity    Worry: Not on file    Inability: Not on file  . Transportation needs    Medical: Not on file    Non-medical: Not on file  Tobacco Use  . Smoking status: Never Smoker  . Smokeless tobacco: Never Used  Substance and Sexual Activity  . Alcohol use: Yes  . Drug use: No  . Sexual activity: Yes    Partners: Female  Lifestyle  . Physical activity    Days per week: Not on file    Minutes per session: Not on file  . Stress: Not on file  Relationships  . Social connections  Talks on phone: Not on file    Gets together: Not on file    Attends religious service: Not on file    Active member of club or organization: Not on file    Attends meetings of clubs or organizations: Not on file    Relationship status: Not on file  Other Topics Concern  . Not on file  Social History Narrative   ** Merged History Encounter **        Vitals:   02/21/19 1039  BP: 120/70  Pulse: (!) 46   Resp: 12  Temp: (!) 97 F (36.1 C)  SpO2: 97%   Body mass index is 29.68 kg/m.   Wt Readings from Last 3 Encounters:  02/21/19 195 lb 3.2 oz (88.5 kg)  01/03/17 190 lb (86.2 kg)  12/28/15 192 lb 9.6 oz (87.4 kg)    Physical Exam  Nursing note and vitals reviewed. Constitutional: He is oriented to person, place, and time. He appears well-developed. No distress.  HENT:  Head: Normocephalic and atraumatic.  Right Ear: Tympanic membrane, external ear and ear canal normal.  Left Ear: Tympanic membrane, external ear and ear canal normal.  Mouth/Throat: Oropharynx is clear and moist and mucous membranes are normal.  Eyes: Pupils are equal, round, and reactive to light. EOM are normal.  Pterygium bilateral,nasal aspect.   Neck: Normal range of motion. No tracheal deviation present. No thyromegaly present.  Cardiovascular: Regular rhythm. Bradycardia present.  No murmur heard. Pulses:      Dorsalis pedis pulses are 2+ on the right side and 2+ on the left side.  Respiratory: Effort normal and breath sounds normal. No respiratory distress.  GI: Soft. He exhibits no mass. There is no hepatomegaly. There is no abdominal tenderness.  Genitourinary: Rectum:     No rectal mass, tenderness or abnormal anal tone.  Prostate is enlarged (Mild). Prostate is not tender.  Musculoskeletal:        General: No tenderness or edema.     Comments: No major deformities appreciated and no signs of synovitis.  Lymphadenopathy:    He has no cervical adenopathy.       Right: No supraclavicular adenopathy present.       Left: No supraclavicular adenopathy present.  Neurological: He is alert and oriented to person, place, and time. He has normal strength. No cranial nerve deficit or sensory deficit. Coordination and gait normal.  Reflex Scores:      Bicep reflexes are 2+ on the right side and 2+ on the left side.      Patellar reflexes are 2+ on the right side and 2+ on the left side. Skin: Skin is warm.  Rash noted. No erythema.  Scattered micropapular rash on fingers and scratching marks.  Psychiatric: He has a normal mood and affect. Cognition and memory are normal.  Well groomed, good eye contact.   ASSESSMENT AND PLAN:  Nathan Parrish was seen today for annual exam.  Diagnoses and all orders for this visit:  Orders Placed This Encounter  Procedures  . Flu Vaccine QUAD 36+ mos IM  . Basic metabolic panel  . Hemoglobin A1c  . TSH  . Lipid panel  . PSA(Must document that pt has been informed of limitations of PSA testing.)  . Urinalysis, Routine w reflex microscopic   Lab Results  Component Value Date   TSH 0.92 02/21/2019   Lab Results  Component Value Date   CREATININE 0.91 02/21/2019   BUN 21 02/21/2019   NA 139 02/21/2019  K 4.8 02/21/2019   CL 102 02/21/2019   CO2 30 02/21/2019   Lab Results  Component Value Date   HGBA1C 5.3 02/21/2019   Lab Results  Component Value Date   CHOL 197 02/21/2019   HDL 53.70 02/21/2019   LDLCALC 120 (H) 02/21/2019   TRIG 116.0 02/21/2019   CHOLHDL 4 02/21/2019     Routine general medical examination at a health care facility We discussed the importance of regular physical activity and healthy diet for prevention of chronic illness and/or complications. Preventive guidelines reviewed. Vaccination up to date.  Next CPE in a year.  The 10-year ASCVD risk score Denman George DC Montez Hageman., et al., 2013) is: 1.6%   Values used to calculate the score:     Age: 80 years     Sex: Male     Is Non-Hispanic African American: No     Diabetic: No     Tobacco smoker: No     Systolic Blood Pressure: 120 mmHg     Is BP treated: No     HDL Cholesterol: 53.7 mg/dL     Total Cholesterol: 197 mg/dL  Bradycardia, sinus Asymptomatic and it has been stable. Instructed about warning signs.  BPH associated with nocturia Mild enlarged prostate. Educated about Dx and treatment options. For now we will hold on pharmacologic treatment. Recommend avoiding  fluid intake 3-4 hours before bedtime.  -     PSA(Must document that pt has been informed of limitations of PSA testing.)  Screening for lipoid disorders -     Lipid panel  Screening for endocrine, metabolic and immunity disorder -     Basic metabolic panel -     Hemoglobin A1c  Urinary frequency -     Urinalysis, Routine w reflex microscopic  Dyshidrotic hand dermatitis Side effects of topical steroid discussed. Recommend wearing cotton gloves under rubber globes. Monitor for signs of infection.  -     clobetasol cream (TEMOVATE) 0.05 %; Apply 1 application topically 2 (two) times daily as needed. Small amount at the time.  Need for immunization against influenza -     Flu Vaccine QUAD 36+ mos IM   Return in 1 year (on 02/21/2020) for cpe.    Betty G. [place], MD  [facility]. [place] office.

## 2019-02-21 NOTE — Patient Instructions (Addendum)
A few things to remember from today's visit:   Routine general medical examination at a health care facility  Bradycardia, sinus - Plan: TSH  BPH associated with nocturia - Plan: PSA(Must document that pt has been informed of limitations of PSA testing.)  Screening for lipoid disorders - Plan: Lipid panel  Screening for endocrine, metabolic and immunity disorder - Plan: Basic metabolic panel, Hemoglobin A1c  Urinary frequency - Plan: Urinalysis, Routine w reflex microscopic  Dyshidrotic hand dermatitis - Plan: clobetasol cream (TEMOVATE) 0.05 %    At least 150 minutes of moderate exercise per week, daily brisk walking for 15-30 min is a good exercise option. Healthy diet low in saturated (animal) fats and sweets and consisting of fresh fruits and vegetables, lean meats such as fish and white chicken and whole grains.  - Vaccines:  Tdap vaccine every 10 years.  Shingles vaccine recommended at age 64, could be given after 45 years of age but not sure about insurance coverage.  Pneumonia vaccines:  Pneumovax at 17   -Screening recommendations for low/normal risk males:  Screening for diabetes at age 65 and every 3 years. Earlier screening if cardiovascular risk factors.   Lipid screening at 35 and every 3 years. Screening starts in younger males with cardiovascular risk factors.  Colon cancer screening at age 37 and until age 33.  Prostate cancer screening: some controversy, starts usually at 67: Rectal exam and PSA.  Aortic Abdominal Aneurism once between 77 and 28 years old if ever smoker.  Also recommended:  1. Dental visit- Brush and floss your teeth twice daily; visit your dentist twice a year. 2. Eye doctor- Get an eye exam at least every 2 years. 3. Helmet use- Always wear a helmet when riding a bicycle, motorcycle, rollerblading or skateboarding. 4. Safe sex- If you may be exposed to sexually transmitted infections, use a condom. 5. Seat belts- Seat belts can  save your live; always wear one. 6. Smoke/Carbon Monoxide detectors- These detectors need to be installed on the appropriate level of your home. Replace batteries at least once a year. 7. Skin cancer- When out in the sun please cover up and use sunscreen 15 SPF or higher. 8. Violence- If anyone is threatening or hurting you, please tell your healthcare provider.  9. Drink alcohol in moderation- Limit alcohol intake to one drink or less per day. Never drink and drive.

## 2019-03-02 ENCOUNTER — Encounter: Payer: Self-pay | Admitting: Family Medicine

## 2020-02-06 ENCOUNTER — Ambulatory Visit: Payer: BLUE CROSS/BLUE SHIELD | Admitting: Family Medicine

## 2020-02-09 ENCOUNTER — Encounter: Payer: Self-pay | Admitting: Family Medicine

## 2020-03-19 ENCOUNTER — Encounter: Payer: Self-pay | Admitting: Family Medicine

## 2020-03-19 ENCOUNTER — Other Ambulatory Visit: Payer: Self-pay

## 2020-03-19 ENCOUNTER — Ambulatory Visit (INDEPENDENT_AMBULATORY_CARE_PROVIDER_SITE_OTHER): Payer: BLUE CROSS/BLUE SHIELD | Admitting: Family Medicine

## 2020-03-19 VITALS — BP 118/78 | HR 50 | Temp 97.8°F | Resp 16 | Ht 68.0 in | Wt 200.6 lb

## 2020-03-19 DIAGNOSIS — L301 Dyshidrosis [pompholyx]: Secondary | ICD-10-CM

## 2020-03-19 DIAGNOSIS — N401 Enlarged prostate with lower urinary tract symptoms: Secondary | ICD-10-CM

## 2020-03-19 DIAGNOSIS — Z1329 Encounter for screening for other suspected endocrine disorder: Secondary | ICD-10-CM

## 2020-03-19 DIAGNOSIS — Z13220 Encounter for screening for lipoid disorders: Secondary | ICD-10-CM

## 2020-03-19 DIAGNOSIS — Z1211 Encounter for screening for malignant neoplasm of colon: Secondary | ICD-10-CM

## 2020-03-19 DIAGNOSIS — Z23 Encounter for immunization: Secondary | ICD-10-CM

## 2020-03-19 DIAGNOSIS — Z1159 Encounter for screening for other viral diseases: Secondary | ICD-10-CM | POA: Diagnosis not present

## 2020-03-19 DIAGNOSIS — R351 Nocturia: Secondary | ICD-10-CM

## 2020-03-19 DIAGNOSIS — Z130 Encounter for screening for diseases of the blood and blood-forming organs and certain disorders involving the immune mechanism: Secondary | ICD-10-CM

## 2020-03-19 DIAGNOSIS — Z0000 Encounter for general adult medical examination without abnormal findings: Secondary | ICD-10-CM | POA: Diagnosis not present

## 2020-03-19 DIAGNOSIS — Z13228 Encounter for screening for other metabolic disorders: Secondary | ICD-10-CM

## 2020-03-19 DIAGNOSIS — R140 Abdominal distension (gaseous): Secondary | ICD-10-CM

## 2020-03-19 LAB — URINALYSIS, ROUTINE W REFLEX MICROSCOPIC
Bilirubin Urine: NEGATIVE
Hgb urine dipstick: NEGATIVE
Ketones, ur: NEGATIVE
Leukocytes,Ua: NEGATIVE
Nitrite: NEGATIVE
RBC / HPF: NONE SEEN (ref 0–?)
Specific Gravity, Urine: 1.025 (ref 1.000–1.030)
Total Protein, Urine: NEGATIVE
Urine Glucose: NEGATIVE
Urobilinogen, UA: 0.2 (ref 0.0–1.0)
WBC, UA: NONE SEEN (ref 0–?)
pH: 6 (ref 5.0–8.0)

## 2020-03-19 LAB — BASIC METABOLIC PANEL
BUN: 19 mg/dL (ref 6–23)
CO2: 32 mEq/L (ref 19–32)
Calcium: 9.4 mg/dL (ref 8.4–10.5)
Chloride: 103 mEq/L (ref 96–112)
Creatinine, Ser: 0.99 mg/dL (ref 0.40–1.50)
GFR: 91.25 mL/min (ref >60.00)
Glucose, Bld: 90 mg/dL (ref 70–99)
Potassium: 4.4 mEq/L (ref 3.5–5.1)
Sodium: 139 mEq/L (ref 135–145)

## 2020-03-19 LAB — LIPID PANEL
Cholesterol: 200 mg/dL (ref 0–200)
HDL: 56.2 mg/dL (ref >39.00)
LDL Cholesterol: 124 mg/dL — ABNORMAL HIGH (ref 0–99)
NonHDL: 143.31
Total CHOL/HDL Ratio: 4
Triglycerides: 95 mg/dL (ref 0.0–149.0)
VLDL: 19 mg/dL (ref 0.0–40.0)

## 2020-03-19 LAB — HEMOGLOBIN A1C: Hgb A1c MFr Bld: 5.3 % (ref 4.6–6.5)

## 2020-03-19 MED ORDER — CLOBETASOL PROPIONATE 0.05 % EX CREA: 1.0000 "application " | TOPICAL_CREAM | Freq: Two times a day (BID) | CUTANEOUS | 2 refills | Status: AC | PRN

## 2020-03-19 NOTE — Patient Instructions (Addendum)
A few things to remember from today's visit:  Routine general medical examination at a health care facility - Plan: Hepatitis C antibody  Encounter for HCV screening test for low risk patient  BPH associated with nocturia - Plan: Urinalysis, Routine w reflex microscopic  Colon cancer screening - Plan: Ambulatory referral to Gastroenterology  Screening for lipoid disorders - Plan: Lipid panel  Screening for endocrine, metabolic and immunity disorder - Plan: BASIC METABOLIC PANEL WITH GFR, Hemoglobin A1c  Please be sure medication list is accurate. If a new problem present, please set up appointment sooner than planned today.   Mantenimiento de Research officer, political party, Male Adoptar un estilo de vida saludable y recibir atencin preventiva son importantes para promover la salud y Counsellor. Consulte al mdico sobre:  El esquema adecuado para hacerse pruebas y exmenes peridicos.  Cosas que puede hacer por su cuenta para prevenir enfermedades y [place] sano. Qu debo saber sobre la dieta, el peso y el ejercicio? Consuma una dieta saludable   Consuma una dieta que incluya muchas verduras, frutas, productos lcteos con bajo contenido de [place] y Associate Professor.  No consuma muchos alimentos ricos en grasas slidas, azcares agregados o sodio. Mantenga un peso saludable El ndice de masa muscular [facility]) es una medida que puede utilizarse para identificar posibles problemas de [place]. Proporciona una estimacin de la grasa corporal basndose en el peso y la altura. Su mdico puede ayudarle a Engineer, site [facility]MC y a Personnel officer o Pharmacologist un peso saludable. Haga ejercicio con regularidad Haga ejercicio con regularidad. Esta es una de las prcticas ms importantes que puede hacer por su salud. La mayora de los adultos deben seguir estas pautas:  Education officer, environmental, al menos, de actividad fsica por semana. El ejercicio debe aumentar la frecuencia cardaca y Media planner  transpirar (ejercicio de intensidad moderada).  Hacer ejercicios de fortalecimiento por lo [employer] por semana. Agregue esto a su plan de ejercicio de intensidad moderada.  Pasar menos tiempo sentados. Incluso la actividad fsica ligera puede ser beneficiosa. Controle sus niveles de colesterol y lpidos en la sangre Comience a realizarse anlisis de lpidos y Oncologist en la sangre a los 20aos y luego reptalos cada 5aos. Es posible que Insurance underwriter los niveles de colesterol con mayor frecuencia si:  Sus niveles de lpidos y colesterol son altos.  Es mayor de 40aos.  Presenta un alto riesgo de padecer enfermedades cardacas. Qu debo saber sobre las pruebas de deteccin del cncer? Muchos tipos de cncer pueden detectarse de manera temprana y, a menudo, pueden prevenirse. Segn su historia clnica y sus antecedentes familiares, es posible que deba realizarse pruebas de deteccin del cncer en diferentes edades. Esto puede incluir pruebas de deteccin de lo siguiente:  Building services engineer.  Cncer de prstata.  Cncer de piel.  Cncer de pulmn. Qu debo saber sobre la enfermedad cardaca, la diabetes y la hipertensin arterial? Presin arterial y enfermedad cardaca  La hipertensin arterial causa enfermedades cardacas y [place] el riesgo de accidente cerebrovascular. Es ms probable que esto se manifieste en las personas que tienen lecturas de presin arterial alta, tienen ascendencia africana o tienen sobrepeso.  Hable con el mdico sobre sus valores de presin arterial deseados.  Hgase controlar la presin arterial: ? Cada 3 a 5 aos si tiene entre 18 y 59 aos. ? Todos los aos si es mayor de [place].  Si tiene entre 43 y 7 aos y es fumador o Insurance underwriter, pregntele al mdico si debe  realizarse una prueba de deteccin de aneurisma artico abdominal (AAA) por nica vez. Diabetes Realcese exmenes de deteccin de la diabetes con regularidad. Este  anlisis revisa el nivel de azcar en la sangre en [place]. Hgase las pruebas de deteccin:  Cada tresaos despus de los 45aos de edad si tiene un peso normal y un bajo riesgo de padecer diabetes.  Con ms frecuencia y a partir de [place] edad inferior si tiene sobrepeso o un alto riesgo de padecer diabetes. Qu debo saber sobre la prevencin de infecciones? Hepatitis B Si tiene un riesgo ms alto de contraer hepatitis B, debe someterse a un examen de deteccin de este virus. Hable con el mdico para averiguar si tiene riesgo de contraer la infeccin por hepatitis B. Hepatitis C Se recomienda un anlisis de [place] para:  Todos los que nacieron entre 1945 y [phone].  Todas las personas que tengan un riesgo de haber contrado hepatitis C. Enfermedades de transmisin sexual (ETS)  Debe realizarse pruebas de deteccin de ITS todos los aos, incluidas la gonorrea y la clamidia, si: ? Es sexualmente activo y es menor de 24aos. ? Es mayor de 24aos, y Public affairs consultant informa que corre riesgo de tener este tipo de infecciones. ? La actividad sexual ha cambiado desde que le hicieron la ltima prueba de deteccin y tiene un riesgo mayor de Warehouse manager clamidia o Copy. Pregntele al mdico si usted tiene riesgo.  Pregntele al mdico si usted tiene un alto riesgo de Primary school teacher VIH. El mdico tambin puede recomendarle un medicamento recetado para ayudar a evitar la infeccin por el VIH. Si elige tomar medicamentos para prevenir el VIH, primero debe [employer] de deteccin del VIH. Luego debe hacerse anlisis cada mientras est tomando los medicamentos. Siga estas instrucciones en su casa: Estilo de vida  No consuma ningn producto que contenga nicotina o tabaco, como cigarrillos, cigarrillos electrnicos y tabaco de Theatre manager. Si necesita ayuda para dejar de fumar, consulte al mdico.  No consuma drogas.  No comparta agujas.  Solicite ayuda a su mdico si necesita apoyo o informacin  para abandonar las drogas. Consumo de alcohol  No beba alcohol si el mdico se lo prohbe.  Si bebe alcohol: ? Limite la cantidad que consume de 0 a 2 medidas por da. ? Est atento a la cantidad de alcohol que hay en las bebidas que toma. En los [place], una medida equivale a una botella de cerveza de 12oz ( ), un vaso de vino de 5oz ( ) o un vaso de una bebida alcohlica de alta graduacin de 1oz (40ml). Instrucciones generales  Realcese los estudios de rutina de la salud, dentales y de Wellsite geologist.  Mantngase al da con las vacunas.  Infrmele a su mdico si: ? Se siente deprimido con frecuencia. ? Alguna vez ha sido vctima de [place] o no se siente seguro en su casa. Resumen  Adoptar un estilo de vida saludable y recibir atencin preventiva son importantes para promover la salud y Counsellor.  Siga las instrucciones del mdico acerca de una dieta saludable, el ejercicio y la realizacin de pruebas o exmenes para Hotel manager.  Siga las instrucciones del mdico con respecto al control del colesterol y la presin arterial. Esta informacin no tiene Theme park manager el consejo del mdico. Asegrese de hacerle al mdico cualquier pregunta que tenga. Document Revised: 03/30/2018 Document Reviewed: 03/30/2018 Elsevier Patient Education  2020 [employer].

## 2020-03-19 NOTE — Progress Notes (Signed)
HPI:  Nathan Parrish is a 44 y.o.male here today for his routine physical examination.  Last CPE: 02/21/19 He lives with his wife and 4 of their 5 children (14,17,20,22,and 61).His oldest child is in the army, comes back home when on vacation.  Regular exercise 3 or more times per week: He is not exercising regularly for the past year. Following a healthy diet: Sometimes, he has not been consistent.  Chronic medical problems: Sinus bradycardia,eczema,BPH,and allergic rhinitis.  Immunization History  Administered Date(s) Administered   Influenza,inj,Quad PF,6+ Mos 12/28/2015, 12/31/2017, 02/21/2019, 03/19/2020   Tdap 01/03/2017   -Hep C screening: Never  Last colon cancer screening: Never. Last prostate ca screening:  Lab Results  Component Value Date   PSA 0.81 02/21/2019   Nocturia x 1-2.  Negative for high alcohol intake and tobacco use.  -Concerns and/or follow up today:  Bloating sensation and "gas", exacerbated by skipping meals. No abdominal pain,melena,or change sin bowel habits. Bowel movement 1-2 per day, his normal.  Eczema: He needs refills on topical steroids. Hand pruritic rash. Clobetasol daily prn has helped.  He would like UA done. Denies dysuria,increased urinary frequency, gross hematuria,or decreased urine output.  Lab Results  Component Value Date   CHOL 197 02/21/2019   HDL 53.70 02/21/2019   LDLCALC 120 (H) 02/21/2019   TRIG 116.0 02/21/2019   CHOLHDL 4 02/21/2019   Review of Systems  Constitutional: Negative for activity change, appetite change, fatigue and fever.  HENT: Negative for hearing loss, nosebleeds, sore throat and trouble swallowing.   Eyes: Negative for redness and visual disturbance.  Respiratory: Negative for cough, shortness of breath and wheezing.   Cardiovascular: Negative for chest pain, palpitations and leg swelling.  Gastrointestinal: Negative for blood in stool, nausea and vomiting.  Endocrine: Negative  for cold intolerance, heat intolerance, polydipsia, polyphagia and polyuria.  Genitourinary: Negative for dysuria, genital sores and testicular pain.  Musculoskeletal: Negative for gait problem and myalgias.  Skin: Positive for rash. Negative for color change.  Allergic/Immunologic: Positive for environmental allergies.  Neurological: Negative for syncope, weakness and headaches.  Hematological: Negative for adenopathy. Does not bruise/bleed easily.  Psychiatric/Behavioral: Negative for confusion and sleep disturbance. The patient is not nervous/anxious.   All other systems reviewed and are negative.  No current outpatient medications on file prior to visit.   No current facility-administered medications on file prior to visit.   Past Medical History:  Diagnosis Date   Allergy    History reviewed. No pertinent surgical history.  No Known Allergies  Family History  Problem Relation Age of Onset   Hypertension Mother    Hypertension Father    Asthma Sister    Asthma Sister    Asthma Sister     Social History   Socioeconomic History   Marital status: Married    Spouse name: Not on file   Number of children: Not on file   Years of education: Not on file   Highest education level: Not on file  Occupational History   Not on file  Tobacco Use   Smoking status: Never Smoker   Smokeless tobacco: Never Used  Vaping Use   Vaping Use: Never used  Substance and Sexual Activity   Alcohol use: Yes   Drug use: No   Sexual activity: Yes    Partners: Female  Other Topics Concern   Not on file  Social History Narrative   ** Merged History Encounter **       Social  Determinants of Health   Financial Resource Strain: Not on file  Food Insecurity: Not on file  Transportation Needs: Not on file  Physical Activity: Not on file  Stress: Not on file  Social Connections: Not on file   Vitals:   03/19/20 1002  BP: 118/78  Pulse: (!) 50  Resp: 16  Temp:  97.8 F (36.6 C)  SpO2: 96%   Body mass index is 30.5 kg/m.  Wt Readings from Last 3 Encounters:  03/19/20 200 lb 9.6 oz (91 kg)  02/21/19 195 lb 3.2 oz (88.5 kg)  01/03/17 190 lb (86.2 kg)   Physical Exam Vitals and nursing note reviewed.  Constitutional:      General: He is not in acute distress.    Appearance: He is well-developed.  HENT:     Head: Normocephalic and atraumatic.     Right Ear: Tympanic membrane, ear canal and external ear normal.     Left Ear: Tympanic membrane, ear canal and external ear normal.     Mouth/Throat:     Mouth: Oropharynx is clear and moist. Mucous membranes are dry.     Pharynx: Oropharynx is clear.  Eyes:     Extraocular Movements: Extraocular movements intact and EOM normal.     Conjunctiva/sclera: Conjunctivae normal.     Pupils: Pupils are equal, round, and reactive to light.     Comments: Pterygium bilateral.  Neck:     Thyroid: No thyromegaly.     Trachea: No tracheal deviation.  Cardiovascular:     Rate and Rhythm: Regular rhythm. Bradycardia present.     Pulses:          Dorsalis pedis pulses are 2+ on the right side and 2+ on the left side.     Heart sounds: No murmur heard.   Pulmonary:     Effort: Pulmonary effort is normal. No respiratory distress.     Breath sounds: Normal breath sounds.  Chest:  Breasts:     Right: No supraclavicular adenopathy.     Left: No supraclavicular adenopathy.    Abdominal:     Palpations: Abdomen is soft. There is no hepatomegaly or mass.     Tenderness: There is no abdominal tenderness.  Genitourinary:    Comments: No concerns. Musculoskeletal:        General: No tenderness or edema.     Cervical back: Normal range of motion.     Comments: No major deformities appreciated and no signs of synovitis.  Lymphadenopathy:     Cervical: No cervical adenopathy.     Upper Body:     Right upper body: No supraclavicular adenopathy.     Left upper body: No supraclavicular adenopathy.  Skin:     General: Skin is warm.     Findings: No erythema.  Neurological:     Mental Status: He is alert and oriented to person, place, and time.     Cranial Nerves: No cranial nerve deficit.     Sensory: No sensory deficit.     Coordination: Coordination normal.     Gait: Gait normal.     Deep Tendon Reflexes: Strength normal.     Reflex Scores:      Bicep reflexes are 2+ on the right side and 2+ on the left side.      Patellar reflexes are 2+ on the right side and 2+ on the left side. Psychiatric:        Mood and Affect: Mood and affect normal. Mood is not anxious  or depressed.        Cognition and Memory: Cognition and memory normal.   ASSESSMENT AND PLAN:  Nathan Parrish was seen today for annual exam.  Diagnoses and all orders for this visit: Orders Placed This Encounter  Procedures   Flu Vaccine QUAD 36+ mos IM   Hepatitis C antibody   Urinalysis, Routine w reflex microscopic   Basic Metabolic Panel   Lipid panel   Hemoglobin A1c   Ambulatory referral to Gastroenterology   Lab Results  Component Value Date   HGBA1C 5.3 03/19/2020   Lab Results  Component Value Date   CREATININE 0.99 03/19/2020   BUN 19 03/19/2020   NA 139 03/19/2020   K 4.4 03/19/2020   CL 103 03/19/2020   CO2 32 03/19/2020   Lab Results  Component Value Date   CHOL 200 03/19/2020   HDL 56.20 03/19/2020   LDLCALC 124 (H) 03/19/2020   TRIG 95.0 03/19/2020   CHOLHDL 4 03/19/2020   Routine general medical examination at a health care facility We discussed the importance of regular physical activity and healthy diet for prevention of chronic illness and/or complications. Preventive guidelines reviewed. Vaccination up to date.  Next CPE in a year.  The 10-year ASCVD risk score Denman George(Goff [place] Montez HagemanJr., et al., 2013) is: 1.7%   Values used to calculate the score:     Age: [age] years     Sex: Male     Is Non-Hispanic African American: No     Diabetic: No     Tobacco smoker: No     Systolic Blood Pressure:  118 mmHg     Is BP treated: No     HDL Cholesterol: 56.2 mg/dL     Total Cholesterol: 200 mg/dL  Encounter for HCV screening test for low risk patient -     Hepatitis C antibody  BPH associated with nocturia PSA has been normal and no new symptoms, so we can repeat PSA next year. He would like UA done.  Colon cancer screening -     Ambulatory referral to Gastroenterology  Screening for lipoid disorders -     Lipid panel  Screening for endocrine, metabolic and immunity disorder -     Hemoglobin A1c -     Basic Metabolic Panel  Need for influenza vaccination -     Flu Vaccine QUAD 36+ mos IM  Dyshidrotic hand dermatitis Stable. Continue current management, small amount of clobetasol at the time.  -     clobetasol cream (TEMOVATE) 0.05 %; Apply 1 application topically 2 (two) times daily as needed. Small amount at the time.  Abdominal bloating Hx and examination do not suggest a serious process. Avoid identified trigger factors.   Return in 1 year (on 03/19/2021) for cpe.   Arney Mayabb G. [place]az[place], MD  [facility]. [place] office.   A few things to remember from today's visit:  Routine general medical examination at a health care facility - Plan: Hepatitis C antibody  Encounter for HCV screening test for low risk patient  BPH associated with nocturia - Plan: Urinalysis, Routine w reflex microscopic  Colon cancer screening - Plan: Ambulatory referral to Gastroenterology  Screening for lipoid disorders - Plan: Lipid panel  Screening for endocrine, metabolic and immunity disorder - Plan: BASIC METABOLIC PANEL WITH GFR, Hemoglobin A1c  Please be sure medication list is accurate. If a new problem present, please set up appointment sooner than planned today.   Mantenimiento de [employer] salud en los hombres Health  Maintenance, Male Adoptar un estilo de vida saludable y recibir atencin preventiva son importantes para promover la salud y Counsellor. Consulte al  mdico sobre:  El esquema adecuado para hacerse pruebas y exmenes peridicos.  Cosas que puede hacer por su cuenta para prevenir enfermedades y [place] sano. Qu debo saber sobre la dieta, el peso y el ejercicio? Consuma una dieta saludable   Consuma una dieta que incluya muchas verduras, frutas, productos lcteos con bajo contenido de [place] y Associate Professor.  No consuma muchos alimentos ricos en grasas slidas, azcares agregados o sodio. Mantenga un peso saludable El ndice de masa muscular [facility]) es una medida que puede utilizarse para identificar posibles problemas de [place]. Proporciona una estimacin de la grasa corporal basndose en el peso y la altura. Su mdico puede ayudarle a Engineer, site [facility]MC y a Personnel officer o Pharmacologist un peso saludable. Haga ejercicio con regularidad Haga ejercicio con regularidad. Esta es una de las prcticas ms importantes que puede hacer por su salud. La mayora de los adultos deben seguir estas pautas:  Education officer, environmental, al menos, de actividad fsica por semana. El ejercicio debe aumentar la frecuencia cardaca y Media planner transpirar (ejercicio de intensidad moderada).  Hacer ejercicios de fortalecimiento por lo [employer] por semana. Agregue esto a su plan de ejercicio de intensidad moderada.  Pasar menos tiempo sentados. Incluso la actividad fsica ligera puede ser beneficiosa. Controle sus niveles de colesterol y lpidos en la sangre Comience a realizarse anlisis de lpidos y Oncologist en la sangre a los 20aos y luego reptalos cada 5aos. Es posible que Insurance underwriter los niveles de colesterol con mayor frecuencia si:  Sus niveles de lpidos y colesterol son altos.  Es mayor de 40aos.  Presenta un alto riesgo de padecer enfermedades cardacas. Qu debo saber sobre las pruebas de deteccin del cncer? Muchos tipos de cncer pueden detectarse de manera temprana y, a menudo, pueden prevenirse. Segn su historia clnica y sus  antecedentes familiares, es posible que deba realizarse pruebas de deteccin del cncer en diferentes edades. Esto puede incluir pruebas de deteccin de lo siguiente:  Building services engineer.  Cncer de prstata.  Cncer de piel.  Cncer de pulmn. Qu debo saber sobre la enfermedad cardaca, la diabetes y la hipertensin arterial? Presin arterial y enfermedad cardaca  La hipertensin arterial causa enfermedades cardacas y [place] el riesgo de accidente cerebrovascular. Es ms probable que esto se manifieste en las personas que tienen lecturas de presin arterial alta, tienen ascendencia africana o tienen sobrepeso.  Hable con el mdico sobre sus valores de presin arterial deseados.  Hgase controlar la presin arterial: ? Cada 3 a 5 aos si tiene entre 18 y 17 aos. ? Todos los aos si es mayor de [place].  Si tiene entre 65 y 43 aos y es fumador o Insurance underwriter, pregntele al mdico si debe realizarse una prueba de deteccin de aneurisma artico abdominal (AAA) por nica vez. Diabetes Realcese exmenes de deteccin de la diabetes con regularidad. Este anlisis revisa el nivel de azcar en la sangre en [place]. Hgase las pruebas de deteccin:  Cada tresaos despus de los 45aos de edad si tiene un peso normal y un bajo riesgo de padecer diabetes.  Con ms frecuencia y a partir de [place] edad inferior si tiene sobrepeso o un alto riesgo de padecer diabetes. Qu debo saber sobre la prevencin de infecciones? Hepatitis B Si tiene un riesgo ms alto de contraer hepatitis B, debe someterse a Nurse, learning disability de deteccin de [place]  virus. Hable con el mdico para averiguar si tiene riesgo de contraer la infeccin por hepatitis B. Hepatitis C Se recomienda un anlisis de [place] para:  Todos los que nacieron entre 1945 y [phone].  Todas las personas que tengan un riesgo de haber contrado hepatitis C. Enfermedades de transmisin sexual (ETS)  Debe realizarse pruebas de deteccin de ITS todos los  aos, incluidas la gonorrea y la clamidia, si: ? Es sexualmente activo y es menor de 24aos. ? Es mayor de 24aos, y Public affairs consultant informa que corre riesgo de tener este tipo de infecciones. ? La actividad sexual ha cambiado desde que le hicieron la ltima prueba de deteccin y tiene un riesgo mayor de Warehouse manager clamidia o Copy. Pregntele al mdico si usted tiene riesgo.  Pregntele al mdico si usted tiene un alto riesgo de Primary school teacher VIH. El mdico tambin puede recomendarle un medicamento recetado para ayudar a evitar la infeccin por el VIH. Si elige tomar medicamentos para prevenir el VIH, primero debe [employer] de deteccin del VIH. Luego debe hacerse anlisis cada mientras est tomando los medicamentos. Siga estas instrucciones en su casa: Estilo de vida  No consuma ningn producto que contenga nicotina o tabaco, como cigarrillos, cigarrillos electrnicos y tabaco de Theatre manager. Si necesita ayuda para dejar de fumar, consulte al mdico.  No consuma drogas.  No comparta agujas.  Solicite ayuda a su mdico si necesita apoyo o informacin para abandonar las drogas. Consumo de alcohol  No beba alcohol si el mdico se lo prohbe.  Si bebe alcohol: ? Limite la cantidad que consume de 0 a 2 medidas por da. ? Est atento a la cantidad de alcohol que hay en las bebidas que toma. En los [place], una medida equivale a una botella de cerveza de 12oz ( ), un vaso de vino de 5oz ( ) o un vaso de una bebida alcohlica de alta graduacin de 1oz (14ml). Instrucciones generales  Realcese los estudios de rutina de la salud, dentales y de Wellsite geologist.  Mantngase al da con las vacunas.  Infrmele a su mdico si: ? Se siente deprimido con frecuencia. ? Alguna vez ha sido vctima de [place] o no se siente seguro en su casa. Resumen  Adoptar un estilo de vida saludable y recibir atencin preventiva son importantes para promover la salud y Counsellor.  Siga  las instrucciones del mdico acerca de una dieta saludable, el ejercicio y la realizacin de pruebas o exmenes para Hotel manager.  Siga las instrucciones del mdico con respecto al control del colesterol y la presin arterial. Esta informacin no tiene Theme park manager el consejo del mdico. Asegrese de hacerle al mdico cualquier pregunta que tenga. Document Revised: 03/30/2018 Document Reviewed: 03/30/2018 Elsevier Patient Education  2020 [employer].

## 2020-03-20 LAB — HEPATITIS C ANTIBODY
Hepatitis C Ab: NONREACTIVE
SIGNAL TO CUT-OFF: 0.02 (ref <1.00)

## 2020-04-12 ENCOUNTER — Encounter: Payer: Self-pay | Admitting: Gastroenterology

## 2020-06-12 ENCOUNTER — Encounter: Payer: BLUE CROSS/BLUE SHIELD | Admitting: Gastroenterology

## 2020-07-11 ENCOUNTER — Encounter: Payer: Self-pay | Admitting: Family Medicine

## 2023-03-30 ENCOUNTER — Telehealth: Payer: Self-pay | Admitting: Internal Medicine

## 2023-03-30 NOTE — Telephone Encounter (Signed)
 Pt has been scheduled for 04/05/23, to be re-established.  Due to scheduling conflict, Pt must be rescheduled.  Called [phone]:  Voicemail not set up Called [phone]: LVM on Son's cell [place]) Called [phone]:  Employee said he is not there today.

## 2023-04-05 ENCOUNTER — Encounter: Payer: Self-pay | Admitting: Internal Medicine

## 2023-04-12 ENCOUNTER — Ambulatory Visit: Payer: Self-pay

## 2023-04-12 NOTE — Telephone Encounter (Signed)
Pt is scheduled for 1/21

## 2023-04-12 NOTE — Telephone Encounter (Signed)
Copied from CRM [id number]. Topic: Clinical - Red Word Triage >> Apr 12, 2023  2:25 PM Isabell A wrote: [facility] that prompted transfer to Nurse Triage: Daughter Corrie Dandy calling, states patient has been experiencing blood in his stool.   Chief Complaint: Blood in stool Symptoms: Blood in stool, small amount of constipation  Frequency: Occurred twice Pertinent Negatives: Patient denies pain, diarrhea Disposition: [] ED /[] Urgent Care (no appt availability in office) / [x] Appointment(In office/virtual)/ []  [place]/ [] Home Care/ [] Refused Recommended Disposition /[] [facility] [facility]/ []  Follow-up with PCP Additional Notes: Patient's daughter called with patient reporting that he noticed blood in his stool approximately 1 week ago, and then noticed it again 2 days ago. Patient reports the blood is light red and "is a decent small amount." Patient states he has also has a small amount of constipation. Patient denies any other symptoms at this time. Patient reports similar symptoms a few months ago. Appointment made for patient and instructions given to call back with any new or worsening symptoms.    Reason for Disposition  [1] Rectal bleeding is minimal (e.g., blood just on toilet paper, few drops, streaks on surface of normal formed BM) AND [2] bleeding recurs 3 or more times on treatment  Answer Assessment - Initial Assessment Questions 1. APPEARANCE of BLOOD: "What color is it?" "Is it passed separately, on the surface of the stool, or mixed in with the stool?"      Light red 2. AMOUNT: "How much blood was passed?"      "A little decent amount" 3. FREQUENCY: "How many times has blood been passed with the stools?"      Twice  4. ONSET: "When was the blood first seen in the stools?" (Days or weeks)      2 days ago, and 1 week ago 5. DIARRHEA: "Is there also some diarrhea?" If Yes, ask: "How many diarrhea stools in the past 24 hours?"      No 6. CONSTIPATION: "Do you have  constipation?" If Yes, ask: "How bad is it?"     Some constipation  7. RECURRENT SYMPTOMS: "Have you had blood in your stools before?" If Yes, ask: "When was the last time?" and "What happened that time?"      Reports the same happened a few months ago 8. BLOOD THINNERS: "Do you take any blood thinners?" (e.g., Coumadin/warfarin, Pradaxa/dabigatran, aspirin)     No 9. OTHER SYMPTOMS: "Do you have any other symptoms?"  (e.g., abdomen pain, vomiting, dizziness, fever)     No  Protocols used: Rectal Bleeding-A-AH

## 2023-04-13 ENCOUNTER — Ambulatory Visit (INDEPENDENT_AMBULATORY_CARE_PROVIDER_SITE_OTHER): Payer: BC Managed Care – PPO | Admitting: Urgent Care

## 2023-04-13 ENCOUNTER — Encounter: Payer: Self-pay | Admitting: Urgent Care

## 2023-04-13 VITALS — BP 126/88 | HR 58 | Ht 72.0 in | Wt 193.1 lb

## 2023-04-13 DIAGNOSIS — H6593 Unspecified nonsuppurative otitis media, bilateral: Secondary | ICD-10-CM

## 2023-04-13 DIAGNOSIS — Z23 Encounter for immunization: Secondary | ICD-10-CM | POA: Diagnosis not present

## 2023-04-13 DIAGNOSIS — Z13220 Encounter for screening for lipoid disorders: Secondary | ICD-10-CM

## 2023-04-13 DIAGNOSIS — Z0001 Encounter for general adult medical examination with abnormal findings: Secondary | ICD-10-CM | POA: Diagnosis not present

## 2023-04-13 DIAGNOSIS — Z131 Encounter for screening for diabetes mellitus: Secondary | ICD-10-CM | POA: Diagnosis not present

## 2023-04-13 DIAGNOSIS — K921 Melena: Secondary | ICD-10-CM

## 2023-04-13 DIAGNOSIS — L309 Dermatitis, unspecified: Secondary | ICD-10-CM

## 2023-04-13 DIAGNOSIS — Z125 Encounter for screening for malignant neoplasm of prostate: Secondary | ICD-10-CM

## 2023-04-13 DIAGNOSIS — Z0000 Encounter for general adult medical examination without abnormal findings: Secondary | ICD-10-CM

## 2023-04-13 LAB — CBC WITH DIFFERENTIAL/PLATELET
Basophils Absolute: 0 10*3/uL (ref 0.0–0.1)
Basophils Relative: 1 % (ref 0.0–3.0)
Eosinophils Absolute: 0.4 10*3/uL (ref 0.0–0.7)
Eosinophils Relative: 8.8 % — ABNORMAL HIGH (ref 0.0–5.0)
HCT: 48.6 % (ref 39.0–52.0)
Hemoglobin: 16.5 g/dL (ref 13.0–17.0)
Lymphocytes Relative: 36.8 % (ref 12.0–46.0)
Lymphs Abs: 1.7 10*3/uL (ref 0.7–4.0)
MCHC: 33.9 g/dL (ref 30.0–36.0)
MCV: 90.2 fL (ref 78.0–100.0)
Monocytes Absolute: 0.4 10*3/uL (ref 0.1–1.0)
Monocytes Relative: 8.2 % (ref 3.0–12.0)
Neutro Abs: 2.1 10*3/uL (ref 1.4–7.7)
Neutrophils Relative %: 45.2 % (ref 43.0–77.0)
Platelets: 220 10*3/uL (ref 150.0–400.0)
RBC: 5.39 Mil/uL (ref 4.22–5.81)
RDW: 13.3 % (ref 11.5–15.5)
WBC: 4.6 10*3/uL (ref 4.0–10.5)

## 2023-04-13 LAB — LIPID PANEL
Cholesterol: 239 mg/dL — ABNORMAL HIGH (ref 0–200)
HDL: 58.9 mg/dL (ref >39.00)
LDL Cholesterol: 157 mg/dL — ABNORMAL HIGH (ref 0–99)
NonHDL: 180.01
Total CHOL/HDL Ratio: 4
Triglycerides: 117 mg/dL (ref 0.0–149.0)
VLDL: 23.4 mg/dL (ref 0.0–40.0)

## 2023-04-13 LAB — HEMOGLOBIN A1C: Hgb A1c MFr Bld: 5.6 % (ref 4.6–6.5)

## 2023-04-13 LAB — POCT URINALYSIS DIPSTICK
Bilirubin, UA: NEGATIVE
Blood, UA: NEGATIVE
Glucose, UA: NEGATIVE
Ketones, UA: NEGATIVE
Leukocytes, UA: NEGATIVE
Nitrite, UA: NEGATIVE
Protein, UA: POSITIVE — AB
Spec Grav, UA: 1.025 (ref 1.010–1.025)
Urobilinogen, UA: 0.2 U/dL
pH, UA: 6 (ref 5.0–8.0)

## 2023-04-13 LAB — COMPREHENSIVE METABOLIC PANEL
ALT: 37 U/L (ref 0–53)
AST: 23 U/L (ref 0–37)
Albumin: 4.8 g/dL (ref 3.5–5.2)
Alkaline Phosphatase: 76 U/L (ref 39–117)
BUN: 17 mg/dL (ref 6–23)
CO2: 30 meq/L (ref 19–32)
Calcium: 9.5 mg/dL (ref 8.4–10.5)
Chloride: 103 meq/L (ref 96–112)
Creatinine, Ser: 0.93 mg/dL (ref 0.40–1.50)
GFR: 96.27 mL/min (ref >60.00)
Glucose, Bld: 85 mg/dL (ref 70–99)
Potassium: 4.6 meq/L (ref 3.5–5.1)
Sodium: 140 meq/L (ref 135–145)
Total Bilirubin: 0.7 mg/dL (ref 0.2–1.2)
Total Protein: 7.4 g/dL (ref 6.0–8.3)

## 2023-04-13 LAB — TSH: TSH: 2.68 u[IU]/mL (ref 0.35–5.50)

## 2023-04-13 LAB — PSA: PSA: 1.97 ng/mL (ref 0.10–4.00)

## 2023-04-13 MED ORDER — FLUTICASONE PROPIONATE 50 MCG/ACT NA SUSP: 2.0000 | Freq: Every day | NASAL | 0 refills | Status: DC

## 2023-04-13 MED ORDER — PROCTOFOAM HC 1-1 % EX FOAM: 1.0000 | Freq: Two times a day (BID) | CUTANEOUS | 0 refills | Status: AC

## 2023-04-13 NOTE — Patient Instructions (Addendum)
Lea el folleto adjunto [employer]. Beba mucha agua y asegrese de no esforzarse mientras [place] el bao. Coloque un aplicador por Event organiser al da hasta que se resuelva.  Actualizamos sus anlisis de laboratorio y [place] hoy. Le notificaremos cualquier resultado preocupante.   Recomiendo configurar una cuenta Mychart para acceder a sus registros mdicos en lnea.  Gastroenterologa lo llamar para programar una cita para una colonoscopia o puedes llamar directamente: [phone]  Regrese anualmente, antes si es necesario    Please read the attached handout regarding hemorrhoids. Drink plenty of water and ensure you are not straining while you use the bathroom. Place one applicator rectally twice daily until resolved  We updated your labs and urine sample today. we will notify you with any concerning results.   I recommend setting up a Mychart account to access your health records online.  Gastroenterology will call you to set up an appointment for colonoscopy, or you can call them directly at: [phone]  Return annually, sooner as needed

## 2023-04-13 NOTE — Progress Notes (Signed)
Annual Wellness Visit     Patient: Nathan Parrish, Male    DOB: 25-Nov-1975, 49 y.o.   MRN: [id number]  Subjective  Chief Complaint  Patient presents with   Establish Care    New pt est care and blood in stool. Pt wants a referral to get a colonoscopy     Discussed the use of AI software for clinical note transcription with the patient who gave verbal consent to proceed. Additionally, a Adult nurse was present assisting with the encounter.   Nathan Parrish is a 48 y.o. male who presents today for his Annual Wellness Visit. He reports consuming a general diet. Gym/ health club routine includes cardio, mod to heavy weightlifting, and elliptical. He generally feels well. He reports sleeping well. He does have additional problems to discuss today.   HPI History of Present Illness The patient, a 48 year old with a history of eczema, presents with a chief complaint of rectal bleeding. He reports noticing bright red blood in his stool on two separate occasions within the past week. He also recalls a similar episode occurring approximately five to six months ago while in [place]. The patient denies any associated abdominal pain or discomfort during bowel movements, although he does report occasional straining. He has not previously sought medical evaluation for this issue.  In addition to the rectal bleeding, the patient mentions frequent flatulence, which he believes may be related to his diet. He denies any associated abdominal discomfort or bloating. He also reports a history of eczema, which primarily affects his hands and is exacerbated by cold weather. He has been managing this with over-the-counter cortisone cream, which he finds helpful.  The patient's last annual physical exam was three years ago, and he is not currently on any medications. His last blood work, conducted approximately 3 years ago, revealed slightly elevated LDL cholesterol levels. He reports minimal  exercise routines and his diet is not restricted. He works in [employer], which requires frequent hand washing. He has not had any recent illnesses or changes in his health status.  Vision:Within last year and Dental: No current dental problems, Receives regular dental care, and Last dental visit: a few weeks ago - had a procedure on his molar   Patient Active Problem List   Diagnosis Date Noted   Dyshidrotic hand dermatitis 03/19/2020   BPH associated with nocturia 06/01/2017   Allergic rhinitis 06/01/2017   Bradycardia, sinus 06/01/2017   Past Medical History:  Diagnosis Date   Allergy    History reviewed. No pertinent surgical history. Social History   Tobacco Use   Smoking status: Never   Smokeless tobacco: Never  Vaping Use   Vaping status: Never Used  Substance Use Topics   Alcohol use: Yes   Drug use: No      Medications: Outpatient Medications Prior to Visit  Medication Sig   clobetasol cream (TEMOVATE) 0.05 % Apply 1 application topically 2 (two) times daily as needed. Small amount at the time. (Patient not taking: Reported on 04/13/2023)   No facility-administered medications prior to visit.    No Known Allergies  Patient Care Team: Maretta Bees, [place] as PCP - General (Physician Assistant)  ROS Complete 12 point ROS performed which was negative for all other than pertinent positives listed in HPI     Objective  BP 126/88   Pulse (!) 58   Ht 6' (1.829 m)   Wt 193 lb 1.9 oz (87.6 kg)   SpO2 97%  BMI 26.19 kg/m  BP Readings from Last 3 Encounters:  04/13/23 126/88  03/19/20 118/78  02/21/19 120/70   Wt Readings from Last 3 Encounters:  04/13/23 193 lb 1.9 oz (87.6 kg)  03/19/20 200 lb 9.6 oz (91 kg)  02/21/19 195 lb 3.2 oz (88.5 kg)      Physical Exam Vitals and nursing note reviewed. Exam conducted with a chaperone present.  Constitutional:      General: He is not in acute distress.    Appearance: Normal appearance. He is not  ill-appearing, toxic-appearing or diaphoretic.  HENT:     Head: Normocephalic and atraumatic.     Right Ear: Ear canal and external ear normal. No swelling or tenderness. A middle ear effusion is present. There is no impacted cerumen. Tympanic membrane is scarred. Tympanic membrane is not perforated or erythematous.     Left Ear: Ear canal and external ear normal. No swelling or tenderness. A middle ear effusion is present. There is no impacted cerumen. Tympanic membrane is scarred. Tympanic membrane is not perforated or erythematous.     Nose: Congestion and rhinorrhea present.     Mouth/Throat:     Mouth: Mucous membranes are moist.     Pharynx: Oropharynx is clear. No oropharyngeal exudate or posterior oropharyngeal erythema.  Eyes:     General: No scleral icterus.       Right eye: No discharge.        Left eye: No discharge.     Extraocular Movements: Extraocular movements intact.     Pupils: Pupils are equal, round, and reactive to light.  Neck:     Thyroid: No thyroid mass, thyromegaly or thyroid tenderness.  Cardiovascular:     Rate and Rhythm: Normal rate and regular rhythm.     Pulses: Normal pulses.     Heart sounds: No murmur heard. Pulmonary:     Effort: Pulmonary effort is normal. No respiratory distress.     Breath sounds: Normal breath sounds. No stridor. No wheezing or rhonchi.  Abdominal:     General: Abdomen is flat. Bowel sounds are normal. There is no distension.     Palpations: Abdomen is soft. There is no mass.     Tenderness: There is no abdominal tenderness. There is no guarding.  Genitourinary:    Rectum: Normal. No mass, tenderness, anal fissure or external hemorrhoid. Normal anal tone.  Musculoskeletal:     Cervical back: Normal range of motion and neck supple. No rigidity or tenderness.     Right lower leg: No edema.     Left lower leg: No edema.  Lymphadenopathy:     Cervical: No cervical adenopathy.  Skin:    General: Skin is warm and dry.      Coloration: Skin is not jaundiced.     Findings: No bruising, erythema or rash.  Neurological:     General: No focal deficit present.     Mental Status: He is alert and oriented to person, place, and time.     Sensory: No sensory deficit.     Motor: No weakness.  Psychiatric:        Mood and Affect: Mood normal.        Behavior: Behavior normal.       Most recent functional status assessment:     No data to display         Most recent fall risk assessment:    04/13/2023    8:53 AM  Fall Risk   Falls in  the past year? 0  Number falls in past yr: 0  Injury with Fall? 0  Risk for fall due to : No Fall Risks  Follow up Falls evaluation completed    Most recent depression screenings:    04/13/2023    8:54 AM 03/19/2020    9:47 PM  PHQ 2/9 Scores  PHQ - 2 Score 0 0  PHQ- 9 Score 0    Most recent cognitive screening:     No data to display         Most recent Audit-C alcohol use screening     No data to display         A score of 3 or more in women, and 4 or more in men indicates increased risk for alcohol abuse, EXCEPT if all of the points are from question 1   Vision/Hearing Screen: No results found.  Last CBC Lab Results  Component Value Date   WBC 4.6 08/06/2017   HGB 13.7 08/06/2017   HCT 39.3 08/06/2017   MCV 90.2 08/06/2017   MCH 30.5 01/03/2017   RDW 13.1 08/06/2017   PLT 206.0 08/06/2017   Last metabolic panel Lab Results  Component Value Date   GLUCOSE 90 03/19/2020   NA 139 03/19/2020   K 4.4 03/19/2020   CL 103 03/19/2020   CO2 32 03/19/2020   BUN 19 03/19/2020   CREATININE 0.99 03/19/2020   GFR 91.25 03/19/2020   CALCIUM 9.4 03/19/2020   ANIONGAP 8 01/03/2017   Last lipids Lab Results  Component Value Date   CHOL 200 03/19/2020   HDL 56.20 03/19/2020   LDLCALC 124 (H) 03/19/2020   TRIG 95.0 03/19/2020   CHOLHDL 4 03/19/2020   Last hemoglobin A1c Lab Results  Component Value Date   HGBA1C 5.3 03/19/2020   Last  thyroid functions Lab Results  Component Value Date   TSH 0.92 02/21/2019   Last vitamin D No results found for: "25OHVITD2", "25OHVITD3", "VD25OH" Last vitamin B12 and Folate No results found for: "VITAMINB12", "FOLATE"    Results for orders placed or performed in visit on 04/13/23  POCT urinalysis dipstick  Result Value Ref Range   Color, UA yellow    Clarity, UA clear    Glucose, UA Negative Negative   Bilirubin, UA negative    Ketones, UA negative    Spec Grav, UA 1.025 1.010 - 1.025   Blood, UA negative    pH, UA 6.0 5.0 - 8.0   Protein, UA Positive (A) Negative   Urobilinogen, UA 0.2 0.2 or 1.0 E.U./dL   Nitrite, UA negative    Leukocytes, UA Negative Negative   Appearance     Odor        Assessment & Plan   Annual wellness visit done today including the all of the following: Reviewed patient's Family Medical History Reviewed and updated list of patient's medical providers Assessment of cognitive impairment was done Assessed patient's functional ability Established a written schedule for health screening services Health Risk Assessent Completed and Reviewed  Exercise Activities and Dietary recommendations  Goals   None     Immunization History  Administered Date(s) Administered   Influenza, Seasonal, Injecte, Preservative Fre 04/13/2023   Influenza,inj,Quad PF,6+ Mos 12/28/2015, 12/31/2017, 02/21/2019, 03/19/2020   Tdap 01/03/2017    Health Maintenance  Topic Date Due   Colonoscopy  Never done   COVID-19 Vaccine (1 - 2024-25 season) 05/25/2024 (Originally 11/22/2022)   DTaP/Tdap/Td (2 - Td or Tdap) 01/04/2027   INFLUENZA  VACCINE  Completed   Hepatitis C Screening  Completed   HIV Screening  Completed   HPV VACCINES  Aged Out     Discussed health benefits of physical activity, and encouraged him to engage in regular exercise appropriate for his age and condition.    Problem List Items Addressed This Visit   None Visit Diagnoses        Encounter for routine adult health examination without abnormal findings    -  Primary   Relevant Orders   CBC with Differential/Platelet   Hemoglobin A1c   TSH   Lipid panel   Comprehensive metabolic panel   PSA   POCT urinalysis dipstick (Completed)     Eczema of both hands         Blood in stool       Relevant Medications   hydrocortisone-pramoxine (PROCTOFOAM HC) rectal foam   Other Relevant Orders   Ambulatory referral to Gastroenterology     Need for influenza vaccination       Relevant Orders   Flu vaccine trivalent PF, 6mos and older(Flulaval,Afluria,Fluarix,Fluzone) (Completed)     Middle ear effusion, bilateral       Relevant Medications   fluticasone (FLONASE) 50 MCG/ACT nasal spray     Assessment and Plan  General Health Maintenance Last annual exam and blood work was three years ago. Previous labs showed slightly elevated bad cholesterol. No current medications. -Perform annual physical exam today including blood work and urine sample. -Advise regular exercise and balanced diet to manage cholesterol levels.  Rectal Bleeding Bright red blood in stool, occurred twice in the past week and three days six months ago. No associated pain or straining. External rectal exam did not reveal any hemorrhoids. Possible internal hemorrhoids. -Refer for colonoscopy. -Consider topical cream to prevent further bleeding. -Advise to maintain hydration and soft stools to prevent straining.  Middle ear effusion Secondary to current nasal congestion, viral URI -trial of 1-2 sprays flonase daily as needed  Eczema History of eczema, worsens in cold weather. Currently using over-the-counter cortisone cream. -Recommend [employer] cream for use during winter months.  Dental and Eye Health Regular dental check-ups every six months. Last eye check-up was not recent. -Advise regular eye check-ups.  Return in about 1 year (around 04/12/2024) for Annual Physical.      Maretta Bees, PA

## 2023-05-26 ENCOUNTER — Other Ambulatory Visit: Payer: Self-pay

## 2023-05-26 DIAGNOSIS — H6593 Unspecified nonsuppurative otitis media, bilateral: Secondary | ICD-10-CM

## 2023-05-26 MED ORDER — FLUTICASONE PROPIONATE 50 MCG/ACT NA SUSP: 2.0000 | Freq: Every day | NASAL | 1 refills | Status: AC

## 2023-06-30 ENCOUNTER — Encounter: Payer: Self-pay | Admitting: Urgent Care

## 2024-04-13 ENCOUNTER — Encounter: Payer: BC Managed Care – PPO | Admitting: Urgent Care
